# Patient Record
Sex: Female | Born: 1960 | Race: White | Hispanic: No | Marital: Single | Smoking: Never smoker
Health system: Southern US, Community
[De-identification: ages and names within clinical notes are randomized; demographics above are authoritative.]

## PROBLEM LIST (undated history)

## (undated) DIAGNOSIS — F419 Anxiety disorder, unspecified: Secondary | ICD-10-CM

## (undated) DIAGNOSIS — B9681 Helicobacter pylori [H. pylori] as the cause of diseases classified elsewhere: Secondary | ICD-10-CM

## (undated) DIAGNOSIS — M1990 Unspecified osteoarthritis, unspecified site: Secondary | ICD-10-CM

## (undated) DIAGNOSIS — Z87442 Personal history of urinary calculi: Secondary | ICD-10-CM

## (undated) DIAGNOSIS — N289 Disorder of kidney and ureter, unspecified: Secondary | ICD-10-CM

## (undated) DIAGNOSIS — K219 Gastro-esophageal reflux disease without esophagitis: Secondary | ICD-10-CM

## (undated) DIAGNOSIS — G4730 Sleep apnea, unspecified: Secondary | ICD-10-CM

## (undated) DIAGNOSIS — G43909 Migraine, unspecified, not intractable, without status migrainosus: Secondary | ICD-10-CM

## (undated) DIAGNOSIS — K3184 Gastroparesis: Secondary | ICD-10-CM

## (undated) DIAGNOSIS — B029 Zoster without complications: Secondary | ICD-10-CM

## (undated) DIAGNOSIS — E7800 Pure hypercholesterolemia, unspecified: Secondary | ICD-10-CM

## (undated) DIAGNOSIS — E119 Type 2 diabetes mellitus without complications: Secondary | ICD-10-CM

## (undated) HISTORY — PX: TUBAL LIGATION: SHX77

## (undated) HISTORY — PX: CYSTOSCOPY: SUR368

## (undated) HISTORY — PX: OTHER SURGICAL HISTORY: SHX169

## (undated) HISTORY — DX: Helicobacter pylori (H. pylori) as the cause of diseases classified elsewhere: B96.81

## (undated) HISTORY — DX: Peptic ulcer, site unspecified, unspecified as acute or chronic, without hemorrhage or perforation: B96.81

## (undated) HISTORY — PX: CHOLECYSTECTOMY: SHX55

## (undated) HISTORY — PX: ENDOMETRIAL ABLATION: SHX621

---

## 2000-08-04 ENCOUNTER — Inpatient Hospital Stay (HOSPITAL_COMMUNITY): Admission: EM | Admit: 2000-08-04 | Discharge: 2000-08-07 | Payer: Self-pay | Admitting: Family Medicine

## 2000-08-04 ENCOUNTER — Encounter: Payer: Self-pay | Admitting: *Deleted

## 2001-03-01 ENCOUNTER — Ambulatory Visit (HOSPITAL_COMMUNITY): Admission: RE | Admit: 2001-03-01 | Discharge: 2001-03-01 | Payer: Self-pay | Admitting: Internal Medicine

## 2001-03-01 ENCOUNTER — Encounter: Payer: Self-pay | Admitting: Internal Medicine

## 2001-03-26 ENCOUNTER — Ambulatory Visit (HOSPITAL_COMMUNITY): Admission: RE | Admit: 2001-03-26 | Discharge: 2001-03-26 | Payer: Self-pay | Admitting: Internal Medicine

## 2001-03-26 ENCOUNTER — Encounter: Payer: Self-pay | Admitting: Internal Medicine

## 2001-03-28 ENCOUNTER — Emergency Department (HOSPITAL_COMMUNITY): Admission: EM | Admit: 2001-03-28 | Discharge: 2001-03-28 | Payer: Self-pay | Admitting: *Deleted

## 2002-06-20 ENCOUNTER — Ambulatory Visit (HOSPITAL_COMMUNITY): Admission: RE | Admit: 2002-06-20 | Discharge: 2002-06-20 | Payer: Self-pay | Admitting: Obstetrics & Gynecology

## 2003-08-18 ENCOUNTER — Inpatient Hospital Stay (HOSPITAL_COMMUNITY): Admission: EM | Admit: 2003-08-18 | Discharge: 2003-08-20 | Payer: Self-pay | Admitting: Emergency Medicine

## 2003-11-27 ENCOUNTER — Emergency Department (HOSPITAL_COMMUNITY): Admission: EM | Admit: 2003-11-27 | Discharge: 2003-11-27 | Payer: Self-pay | Admitting: Emergency Medicine

## 2004-08-01 ENCOUNTER — Emergency Department: Payer: Self-pay | Admitting: Emergency Medicine

## 2004-08-11 ENCOUNTER — Ambulatory Visit: Payer: Self-pay | Admitting: Specialist

## 2005-02-19 ENCOUNTER — Emergency Department: Payer: Self-pay | Admitting: Emergency Medicine

## 2005-02-24 ENCOUNTER — Emergency Department: Payer: Self-pay | Admitting: Emergency Medicine

## 2008-08-11 ENCOUNTER — Emergency Department: Payer: Self-pay | Admitting: Emergency Medicine

## 2008-08-17 ENCOUNTER — Ambulatory Visit: Payer: Self-pay

## 2009-12-14 ENCOUNTER — Emergency Department: Payer: Self-pay | Admitting: Emergency Medicine

## 2010-03-03 ENCOUNTER — Emergency Department: Payer: Self-pay | Admitting: Emergency Medicine

## 2010-07-03 ENCOUNTER — Emergency Department: Payer: Self-pay | Admitting: Emergency Medicine

## 2012-01-12 DIAGNOSIS — F32A Depression, unspecified: Secondary | ICD-10-CM | POA: Insufficient documentation

## 2012-01-12 DIAGNOSIS — F329 Major depressive disorder, single episode, unspecified: Secondary | ICD-10-CM | POA: Insufficient documentation

## 2012-02-12 DIAGNOSIS — I10 Essential (primary) hypertension: Secondary | ICD-10-CM | POA: Insufficient documentation

## 2012-03-12 DIAGNOSIS — G4700 Insomnia, unspecified: Secondary | ICD-10-CM | POA: Insufficient documentation

## 2012-08-30 DIAGNOSIS — J309 Allergic rhinitis, unspecified: Secondary | ICD-10-CM | POA: Insufficient documentation

## 2012-08-30 DIAGNOSIS — N959 Unspecified menopausal and perimenopausal disorder: Secondary | ICD-10-CM | POA: Insufficient documentation

## 2012-08-30 DIAGNOSIS — G43909 Migraine, unspecified, not intractable, without status migrainosus: Secondary | ICD-10-CM | POA: Insufficient documentation

## 2013-07-02 ENCOUNTER — Ambulatory Visit: Payer: Self-pay | Admitting: Surgery

## 2013-07-03 LAB — PATHOLOGY REPORT

## 2013-09-30 DIAGNOSIS — M25569 Pain in unspecified knee: Secondary | ICD-10-CM | POA: Insufficient documentation

## 2013-11-27 ENCOUNTER — Emergency Department: Payer: Self-pay | Admitting: Emergency Medicine

## 2013-11-27 LAB — CBC
HCT: 43.6 % (ref 35.0–47.0)
HGB: 14.6 g/dL (ref 12.0–16.0)
MCH: 31.9 pg (ref 26.0–34.0)
MCHC: 33.4 g/dL (ref 32.0–36.0)
MCV: 96 fL (ref 80–100)
PLATELETS: 267 10*3/uL (ref 150–440)
RBC: 4.57 10*6/uL (ref 3.80–5.20)
RDW: 12.6 % (ref 11.5–14.5)
WBC: 13.9 10*3/uL — AB (ref 3.6–11.0)

## 2013-11-27 LAB — BASIC METABOLIC PANEL
Anion Gap: 9 (ref 7–16)
BUN: 15 mg/dL (ref 7–18)
CALCIUM: 8.9 mg/dL (ref 8.5–10.1)
CHLORIDE: 105 mmol/L (ref 98–107)
CO2: 26 mmol/L (ref 21–32)
CREATININE: 0.77 mg/dL (ref 0.60–1.30)
GLUCOSE: 101 mg/dL — AB (ref 65–99)
OSMOLALITY: 280 (ref 275–301)
Potassium: 3.5 mmol/L (ref 3.5–5.1)
SODIUM: 140 mmol/L (ref 136–145)

## 2013-11-27 LAB — TROPONIN I: Troponin-I: <0.02 ng/mL

## 2013-11-28 LAB — TROPONIN I: Troponin-I: <0.02 ng/mL

## 2013-12-17 DIAGNOSIS — F410 Panic disorder [episodic paroxysmal anxiety] without agoraphobia: Secondary | ICD-10-CM | POA: Insufficient documentation

## 2014-06-24 ENCOUNTER — Other Ambulatory Visit: Payer: Self-pay | Admitting: Family Medicine

## 2014-06-24 DIAGNOSIS — Z1231 Encounter for screening mammogram for malignant neoplasm of breast: Secondary | ICD-10-CM

## 2015-01-21 DIAGNOSIS — E119 Type 2 diabetes mellitus without complications: Secondary | ICD-10-CM | POA: Insufficient documentation

## 2015-02-16 ENCOUNTER — Encounter: Payer: Self-pay | Admitting: Medical Oncology

## 2015-02-16 ENCOUNTER — Ambulatory Visit (INDEPENDENT_AMBULATORY_CARE_PROVIDER_SITE_OTHER)
Admission: EM | Admit: 2015-02-16 | Discharge: 2015-02-16 | Disposition: A | Discharge status: Other Acute Inpatient Hospital | Payer: Commercial Managed Care - HMO | Source: Home / Self Care | Attending: Family Medicine | Admitting: Family Medicine

## 2015-02-16 ENCOUNTER — Emergency Department
Admission: EM | Admit: 2015-02-16 | Discharge: 2015-02-16 | Disposition: A | Discharge status: Home / Self Care | Payer: Commercial Managed Care - HMO | Attending: Emergency Medicine | Admitting: Emergency Medicine

## 2015-02-16 ENCOUNTER — Emergency Department: Payer: Commercial Managed Care - HMO

## 2015-02-16 ENCOUNTER — Encounter: Payer: Self-pay | Admitting: *Deleted

## 2015-02-16 DIAGNOSIS — E119 Type 2 diabetes mellitus without complications: Secondary | ICD-10-CM | POA: Diagnosis not present

## 2015-02-16 DIAGNOSIS — K760 Fatty (change of) liver, not elsewhere classified: Secondary | ICD-10-CM | POA: Diagnosis not present

## 2015-02-16 DIAGNOSIS — Z79899 Other long term (current) drug therapy: Secondary | ICD-10-CM | POA: Diagnosis not present

## 2015-02-16 DIAGNOSIS — Z791 Long term (current) use of non-steroidal anti-inflammatories (NSAID): Secondary | ICD-10-CM | POA: Diagnosis not present

## 2015-02-16 DIAGNOSIS — Z7984 Long term (current) use of oral hypoglycemic drugs: Secondary | ICD-10-CM | POA: Insufficient documentation

## 2015-02-16 DIAGNOSIS — K429 Umbilical hernia without obstruction or gangrene: Secondary | ICD-10-CM

## 2015-02-16 DIAGNOSIS — R100 Acute abdomen: Secondary | ICD-10-CM

## 2015-02-16 DIAGNOSIS — K439 Ventral hernia without obstruction or gangrene: Secondary | ICD-10-CM | POA: Insufficient documentation

## 2015-02-16 DIAGNOSIS — R1011 Right upper quadrant pain: Secondary | ICD-10-CM | POA: Diagnosis present

## 2015-02-16 DIAGNOSIS — R109 Unspecified abdominal pain: Secondary | ICD-10-CM

## 2015-02-16 HISTORY — DX: Disorder of kidney and ureter, unspecified: N28.9

## 2015-02-16 HISTORY — DX: Migraine, unspecified, not intractable, without status migrainosus: G43.909

## 2015-02-16 HISTORY — DX: Type 2 diabetes mellitus without complications: E11.9

## 2015-02-16 LAB — CBC
HEMATOCRIT: 39.3 % (ref 35.0–47.0)
Hemoglobin: 13.5 g/dL (ref 12.0–16.0)
MCH: 31.2 pg (ref 26.0–34.0)
MCHC: 34.3 g/dL (ref 32.0–36.0)
MCV: 91 fL (ref 80.0–100.0)
Platelets: 264 10*3/uL (ref 150–440)
RBC: 4.32 MIL/uL (ref 3.80–5.20)
RDW: 12.6 % (ref 11.5–14.5)
WBC: 9.6 10*3/uL (ref 3.6–11.0)

## 2015-02-16 LAB — COMPREHENSIVE METABOLIC PANEL
ALBUMIN: 4.5 g/dL (ref 3.5–5.0)
ALT: 53 U/L (ref 14–54)
AST: 35 U/L (ref 15–41)
Alkaline Phosphatase: 86 U/L (ref 38–126)
Anion gap: 9 (ref 5–15)
BUN: 16 mg/dL (ref 6–20)
CHLORIDE: 105 mmol/L (ref 101–111)
CO2: 25 mmol/L (ref 22–32)
Calcium: 9.6 mg/dL (ref 8.9–10.3)
Creatinine, Ser: 0.66 mg/dL (ref 0.44–1.00)
GFR calc Af Amer: >60 mL/min (ref >60)
Glucose, Bld: 117 mg/dL — ABNORMAL HIGH (ref 65–99)
POTASSIUM: 4.3 mmol/L (ref 3.5–5.1)
SODIUM: 139 mmol/L (ref 135–145)
Total Bilirubin: 0.4 mg/dL (ref 0.3–1.2)
Total Protein: 7.7 g/dL (ref 6.5–8.1)

## 2015-02-16 LAB — URINALYSIS COMPLETE WITH MICROSCOPIC (ARMC ONLY)
GLUCOSE, UA: NEGATIVE mg/dL
HGB URINE DIPSTICK: NEGATIVE
Ketones, ur: NEGATIVE mg/dL
LEUKOCYTES UA: NEGATIVE
Nitrite: NEGATIVE
PH: 6 (ref 5.0–8.0)
Protein, ur: NEGATIVE mg/dL
RBC / HPF: NONE SEEN RBC/hpf (ref 0–5)
Specific Gravity, Urine: 1.024 (ref 1.005–1.030)

## 2015-02-16 LAB — LIPASE, BLOOD: LIPASE: 29 U/L (ref 11–51)

## 2015-02-16 MED ORDER — ONDANSETRON HCL 4 MG/2ML IJ SOLN
4.0000 mg | Freq: Once | INTRAMUSCULAR | Status: AC
  Administered 2015-02-16: 4 mg via INTRAVENOUS
  Filled 2015-02-16: qty 2

## 2015-02-16 MED ORDER — IOHEXOL 240 MG/ML SOLN
25.0000 mL | Freq: Once | INTRAMUSCULAR | Status: AC | PRN
  Administered 2015-02-16: 25 mL via ORAL
  Filled 2015-02-16: qty 25

## 2015-02-16 MED ORDER — OXYCODONE-ACETAMINOPHEN 5-325 MG PO TABS: 1.0000 | ORAL_TABLET | Freq: Four times a day (QID) | ORAL | Status: DC | PRN

## 2015-02-16 MED ORDER — IOHEXOL 300 MG/ML  SOLN
100.0000 mL | Freq: Once | INTRAMUSCULAR | Status: AC | PRN
  Administered 2015-02-16: 100 mL via INTRAVENOUS
  Filled 2015-02-16: qty 100

## 2015-02-16 MED ORDER — KETOROLAC TROMETHAMINE 30 MG/ML IJ SOLN
30.0000 mg | Freq: Once | INTRAMUSCULAR | Status: AC
  Administered 2015-02-16: 30 mg via INTRAVENOUS
  Filled 2015-02-16: qty 1

## 2015-02-16 MED ORDER — DOCUSATE SODIUM 100 MG PO CAPS: 200.0000 mg | ORAL_CAPSULE | Freq: Two times a day (BID) | ORAL | Status: DC

## 2015-02-16 MED ORDER — SODIUM CHLORIDE 0.9 % IV BOLUS (SEPSIS)
1000.0000 mL | Freq: Once | INTRAVENOUS | Status: AC
  Administered 2015-02-16: 1000 mL via INTRAVENOUS

## 2015-02-16 NOTE — ED Provider Notes (Signed)
[facility] Emergency Department Provider Note  ____________________________________________  Time seen: 5:30 PM  I have reviewed the triage vital signs and the nursing notes.   HISTORY  Chief Complaint Abdominal Pain    HPI Ashley Dickson is a 55 y.o. female who complains of central abdominal pain for the past 4 weeks. A CT was done at [facility] on January 19 which showed that she had a ventral hernia. Denies any nausea vomiting. Her last bowel movement was 2 days ago and normally gets every day. She notes that she has a worsening bulging her upper abdomen when she stands up. Today she also noticed that the skin overlying her right upper quadrant appears to be going numb. This is never happened before.     Past Medical History  Diagnosis Date  . Diabetes mellitus without complication (HCC)   . Renal disorder   . Migraines      There are no active problems to display for this patient.    Past Surgical History  Procedure Laterality Date  . Cholecystectomy    . Tubal ligation       Current Outpatient Rx  Name  Route  Sig  Dispense  Refill  . busPIRone (BUSPAR) 15 MG tablet   Oral   Take 15 mg by mouth 2 (two) times daily.         Marland Kitchen docusate sodium (COLACE) 100 MG capsule   Oral   Take 2 capsules (200 mg total) by mouth 2 (two) times daily.   120 capsule   0   . etodolac (LODINE) 500 MG tablet   Oral   Take 500 mg by mouth daily.         Marland Kitchen lisinopril (PRINIVIL,ZESTRIL) 10 MG tablet   Oral   Take 10 mg by mouth daily.         Marland Kitchen losartan (COZAAR) 25 MG tablet   Oral   Take 25 mg by mouth daily.         . metFORMIN (GLUCOPHAGE) 1000 MG tablet   Oral   Take 1,000 mg by mouth 2 (two) times daily with a meal.         . oxyCODONE-acetaminophen (ROXICET) 5-325 MG tablet   Oral   Take 1 tablet by mouth every 6 (six) hours as needed for severe pain.   10 tablet   0   . sertraline (ZOLOFT) 100 MG tablet   Oral   Take 100 mg by  mouth daily.         Marland Kitchen zolpidem (AMBIEN) 5 MG tablet   Oral   Take 5 mg by mouth at bedtime as needed for sleep.            Allergies Azithromycin and Tramadol   No family history on file.  Social History Social History  Substance Use Topics  . Smoking status: Never Smoker   . Smokeless tobacco: Never Used  . Alcohol Use: Yes    Review of Systems  Constitutional:   No fever or chills. No weight changes Eyes:   No blurry vision or double vision.  ENT:   No sore throat. Cardiovascular:   No chest pain. Respiratory:   No dyspnea or cough. Gastrointestinal:   Positive as above for abdominal pain without vomiting or diarrhea.  No BRBPR or melena. Genitourinary:   Negative for dysuria, urinary retention, bloody urine, or difficulty urinating. Musculoskeletal:   Negative for back pain. No joint swelling or pain. Skin:   Negative for  rash.  Neurological:   Negative for headaches, focal weakness or numbness. Psychiatric:  No anxiety or depression.   Endocrine:  No hot/cold intolerance, changes in energy, or sleep difficulty.  10-point ROS otherwise negative.  ____________________________________________   PHYSICAL EXAM:  VITAL SIGNS: ED Triage Vitals  Enc Vitals Group     BP 02/16/15 1601 138/78 mmHg     Pulse Rate 02/16/15 1601 77     Resp 02/16/15 1601 18     Temp 02/16/15 1601 97.6 F (36.4 C)     Temp Source 02/16/15 1601 Oral     SpO2 02/16/15 1601 100 %     Weight 02/16/15 1601 223 lb (101.152 kg)     Height 02/16/15 1601 5\' 6"  (1.676 m)     Head Cir --      Peak Flow --      Pain Score 02/16/15 1601 8     Pain Loc --      Pain Edu? --      Excl. in GC? --     Vital signs reviewed, nursing assessments reviewed.   Constitutional:   Alert and oriented. Well appearing and in no distress. Eyes:   No scleral icterus. No conjunctival pallor. PERRL. EOMI ENT   Head:   Normocephalic and atraumatic.   Nose:   No congestion/rhinnorhea. No septal  hematoma   Mouth/Throat:   MMM, no pharyngeal erythema. No peritonsillar mass. No uvula shift.   Neck:   No stridor. No SubQ emphysema. No meningismus. Hematological/Lymphatic/Immunilogical:   No cervical lymphadenopathy. Cardiovascular:   RRR. Normal and symmetric distal pulses are present in all extremities. No murmurs, rubs, or gallops. Respiratory:   Normal respiratory effort without tachypnea nor retractions. Breath sounds are clear and equal bilaterally. No wheezes/rales/rhonchi. Gastrointestinal:   Soft and nontender. No distention. There is no CVA tenderness.  No rebound, rigidity, or guarding. There is a ventral hernia approximate 4-5 cm in size, mildly tender, easily mobile and reducible, nonincarcerated. Genitourinary:   deferred Musculoskeletal:   Nontender with normal range of motion in all extremities. No joint effusions.  No lower extremity tenderness.  No edema. Neurologic:   Normal speech and language.  CN 2-10 normal. Motor grossly intact. No pronator drift.  Normal gait. No gross focal neurologic deficits are appreciated.  Skin:    Skin is warm, dry and intact. No rash noted.  No petechiae, purpura, or bullae. There is altered sensation of the skin to the right laterally of the ventral hernia covering the area of the right upper quadrant. Inferiorly, medially, and even more laterally around the right flank she has normal sensation, not corresponding to a dermatomal distribution. Psychiatric:   Mood and affect are normal. Speech and behavior are normal. Patient exhibits appropriate insight and judgment.  ____________________________________________    LABS (pertinent positives/negatives) (all labs ordered are listed, but only abnormal results are displayed) Labs Reviewed  COMPREHENSIVE METABOLIC PANEL - Abnormal; Notable for the following:    Glucose, Bld 117 (*)    All other components within normal limits  URINALYSIS COMPLETEWITH MICROSCOPIC ([facility] ONLY) -  Abnormal; Notable for the following:    Color, Urine YELLOW (*)    APPearance CLEAR (*)    Bilirubin Urine 2+ (*)    Bacteria, UA RARE (*)    Squamous Epithelial / LPF 0-5 (*)    All other components within normal limits  LIPASE, BLOOD  CBC   ____________________________________________   EKG    ____________________________________________    RADIOLOGY (  [facility] CT) DATE: 01/28/15 18:27:43 ACCESSION: [id number] UN DICTATED: 01/28/15 18:59:22 INTERPRETATION LOCATION: Main [facility]  CLINICAL INDICATION: 55 Year Old (F): ABDOMINAL PAIN, (specify site in comments)-epigastric Pt had tubes tied.   COMPARISON: None.  TECHNIQUE: A spiral CT scan was obtained with IV contrast from the lung bases to the pubic symphysis. Images were reconstructed in the axial plane. Coronal and sagittal reformatted images were also provided for further evaluation.  FINDINGS:  The lung bases are clear.  Hepatic steatosis. A hyperattenuating region in hepatic segments VIII and V measures approximately 3.2 x 2.2 x 5.3 cm (AP x ML x CC, 5:43, 6:75). Gallbladder is surgically absent. Fatty atrophy of the pancreas.  The spleen/splenules and adrenals are unremarkable.  Bilateral lower pole nonobstructive renal calculi measure up to 6 mm on the left and 3 mm on the right. No evidence of hydronephrosis.  There is mild mesenteric fat stranding in the mid abdomen adjacent to the third portion of the duodenum (3:31). Sigmoid diverticula without evidence of diverticulitis. The appendix is not identified. No evidence of bowel obstruction. Small fat-containing umbilical hernia.  The uterus and ovaries are unremarkable. The bladder is moderately distended.  The aorta and its branch vessels are patent. The portal vein, splenic vein, and SMV are patent.   Subcentimeter mesenteric lymph nodes in the mid abdomen are likely reactive.   No focal osseous lesions.   IMPRESSION: -Mild fast stranding adjacent to the  duodenum, may represent early or developing duodenitis.  -Nonobstructive bilateral nephrolithiasis. No hydronephrosis.  -Hyperdense hepatic mass, indeterminant. Recommend follow-up non-emergent MRI with contrast for further evaluation when clinically indicated.  -Hepatic steatosis.   ADDENDUM:  1) No duodenitis identified. 2) Hyperattenuating hepatic lesion likely represents an unusual pattern of fatty sparing in this patient with diffuse hepatic steatosis. Abdominal MRI is again recommended for confirmation.    [place] [facility]strates hepatic steatosis but otherwise unremarkable  ____________________________________________   PROCEDURES   ____________________________________________   INITIAL IMPRESSION / ASSESSMENT AND PLAN / ED COURSE  Pertinent labs & imaging results that were available during my care of the patient were reviewed by me and considered in my medical decision making (see chart for details).  Patient well appearing no acute distress, presents with paresthesia of the abdominal wall as well as a ventral hernia. No evidence of obstruction at this time but with her worsen constipation, we'll repeat CT today.  ----------------------------------------- 8:40 PM on 02/16/2015 -----------------------------------------  CT negative. Patient informed of hepatic steatosis and will follow up with primary care regarding this finding. Vital signs remained stable and completely normal. No evidence of hernia on CT further confirming that it is completely reduced at this time. Suggested patient try a abdominal binder. Patient's name relayed to the [facility] surgical to help facilitate follow-up.     ____________________________________________   FINAL CLINICAL IMPRESSION(S) / ED DIAGNOSES  Final diagnoses:  Hepatic steatosis  Hernia of anterior abdominal wall      Sharman Cheek, MD 02/16/15 2042

## 2015-02-16 NOTE — Discharge Instructions (Signed)
You were prescribed a medication that is potentially sedating. Do not drink alcohol, drive or participate in any other potentially dangerous activities while taking this medication as it may make you sleepy. Do not take this medication with any other sedating medications, either prescription or over-the-counter. If you were prescribed Percocet or Vicodin, do not take these with acetaminophen (Tylenol) as it is already contained within these medications.   Opioid pain medications (or "narcotics") can be habit forming.  Use it as little as possible to achieve adequate pain control.  Do not use or use it with extreme caution if you have a history of opiate abuse or dependence.  If you are on a pain contract with your primary care doctor or a pain specialist, be sure to let them know you were prescribed this medication today from the [facility].  This medication is intended for your use only - do not give any to anyone else and keep it in a secure place where nobody else, especially children and pets, have access to it.  It will also cause or worsen constipation, so you may want to consider taking an over-the-counter stool softener while you are taking this medication.  Fatty Liver Fatty liver, also called hepatic steatosis or steatohepatitis, is a condition in which too much fat has built up in your liver cells. The liver removes harmful substances from your bloodstream. It produces fluids your body needs. It also helps your body use and store energy from the food you eat. In many cases, fatty liver does not cause symptoms or problems. It is often diagnosed when tests are being done for other reasons. However, over time, fatty liver can cause inflammation that may lead to more serious liver problems, such as scarring of the liver (cirrhosis). CAUSES  Causes of fatty liver may include:   Drinking too much alcohol.  Poor nutrition.  Obesity.  Cushing  syndrome.  Diabetes.  Hyperlipidemia.  Pregnancy.  Certain drugs.  Poisons.  Some viral infections. RISK FACTORS You may be more likely to develop fatty liver if you:  Abuse alcohol.  Are pregnant.  Are overweight.  Have diabetes.  Have hepatitis.  Have a high triglyceride level.  SIGNS AND SYMPTOMS  Fatty liver often does not cause any symptoms. In cases where symptoms develop, they can include:  Fatigue.  Weakness.  Weight loss.  Confusion.   Abdominal pain.  Yellowing of your skin and the white parts of your eyes (jaundice).  Nausea and vomiting. DIAGNOSIS  Fatty liver may be diagnosed by:   Physical exam and medical history.  Blood tests.  Imaging tests, such as an ultrasound, CT scan, or MRI.  Liver biopsy. A small sample of liver tissue is removed using a needle. The sample is then looked at under a microscope. TREATMENT  Fatty liver is often caused by other health conditions. Treatment for fatty liver may involve medicines and lifestyle changes to manage conditions such as:   Alcoholism.  High cholesterol.  Diabetes.  Being overweight or obese.  HOME CARE INSTRUCTIONS  Eat a healthy diet as directed by your health care provider.  Exercise regularly. This can help you lose weight and control your cholesterol and diabetes. Talk to your health care provider about an exercise plan and which activities are best for you.  Do not drink alcohol.   Take medicines only as directed by your health care provider. SEEK MEDICAL CARE IF: You have difficulty controlling your:  Blood sugar.  Cholesterol.  Alcohol consumption. SEEK IMMEDIATE MEDICAL CARE IF:  You have abdominal pain.  You have jaundice.  You have nausea and vomiting.   This information is not intended to replace advice given to you by your health care provider. Make sure you discuss any questions you have with your health care provider.   Document Released: 02/10/2005  Document Revised: 01/16/2014 Document Reviewed: 05/07/2013 Elsevier Interactive Patient Education 2016 [employer] Inc[employer]  Hernia, Adult A hernia is the bulging of an organ or tissue through a weak spot in the muscles of the abdomen (abdominal wall). Hernias develop most often near the navel or groin. There are many kinds of hernias. Common kinds include:  Femoral hernia. This kind of hernia develops under the groin in the upper thigh area.  Inguinal hernia. This kind of hernia develops in the groin or scrotum.  Umbilical hernia. This kind of hernia develops near the navel.  Hiatal hernia. This kind of hernia causes part of the stomach to be pushed up into the chest.  Incisional hernia. This kind of hernia bulges through a scar from an abdominal surgery. CAUSES This condition may be caused by:  Heavy lifting.  Coughing over a long period of time.  Straining to have a bowel movement.  An incision made during an abdominal surgery.  A birth defect (congenital defect).  Excess weight or obesity.  Smoking.  Poor nutrition.  Cystic fibrosis.  Excess fluid in the abdomen.  Undescended testicles. SYMPTOMS Symptoms of a hernia include:  A lump on the abdomen. This is the first sign of a hernia. The lump may become more obvious with standing, straining, or coughing. It may get bigger over time if it is not treated or if the condition causing it is not treated.  Pain. A hernia is usually painless, but it may become painful over time if treatment is delayed. The pain is usually dull and may get worse with standing or lifting heavy objects. Sometimes a hernia gets tightly squeezed in the weak spot (strangulated) or stuck there (incarcerated) and causes additional symptoms. These symptoms may include:  Vomiting.  Nausea.  Constipation.  Irritability. DIAGNOSIS A hernia may be diagnosed with:  A physical exam. During the exam your health care provider may ask you to cough or to  make a specific movement, because a hernia is usually more visible when you move.  Imaging tests. These can include:  X-rays.  Ultrasound.  CT scan. TREATMENT A hernia that is small and painless may not need to be treated. A hernia that is large or painful may be treated with surgery. Inguinal hernias may be treated with surgery to prevent incarceration or strangulation. Strangulated hernias are always treated with surgery, because lack of blood to the trapped organ or tissue can cause it to die. Surgery to treat a hernia involves pushing the bulge back into place and repairing the weak part of the abdomen. HOME CARE INSTRUCTIONS  Avoid straining.  Do not lift anything heavier than 10 lb (4.5 kg).  Lift with your leg muscles, not your back muscles. This helps avoid strain.  When coughing, try to cough gently.  Prevent constipation. Constipation leads to straining with bowel movements, which can make a hernia worse or cause a hernia repair to break down. You can prevent constipation by:  Eating a high-fiber diet that includes plenty of fruits and vegetables.  Drinking enough fluids to keep your urine clear or pale yellow. Aim to drink 6-8 glasses of water per day.  Using  a stool softener as directed by your health care provider.  Lose weight, if you are overweight.  Do not use any tobacco products, including cigarettes, chewing tobacco, or electronic cigarettes. If you need help quitting, ask your health care provider.  Keep all follow-up visits as directed by your health care provider. This is important. Your health care provider may need to monitor your condition. SEEK MEDICAL CARE IF:  You have swelling, redness, and pain in the affected area.  Your bowel habits change. SEEK IMMEDIATE MEDICAL CARE IF:  You have a fever.  You have abdominal pain that is getting worse.  You feel nauseous or you vomit.  You cannot push the hernia back in place by gently pressing on it  while you are lying down.  The hernia:  Changes in shape or size.  Is stuck outside the abdomen.  Becomes discolored.  Feels hard or tender.   This information is not intended to replace advice given to you by your health care provider. Make sure you discuss any questions you have with your health care provider.   Document Released: 12/26/2004 Document Revised: 01/16/2014 Document Reviewed: 11/05/2013 Elsevier Interactive Patient Education [employer]

## 2015-02-16 NOTE — ED Notes (Signed)
Patient transported to CT 

## 2015-02-16 NOTE — ED Notes (Signed)
Pt ambulatory to triage with reports of central abd pain x 4 weeks, pt had CT done and was told that she has a hernia. Yesterday pt noticed that the rt side of her abdomen felt "numb". Pt denies chest pain/sob. Denies vomiting/diarrhea. No BM in 2 days.

## 2015-02-16 NOTE — ED Provider Notes (Signed)
CSN: [id number]     Arrival date & time 02/16/15  1332 History   First MD Initiated Contact with Patient 02/16/15 1453    Nurses notes were reviewed. Chief Complaint  Patient presents with  . Hernia    umbilical   Patient was diagnosed with a umbilical/ventral hernia at [facility] about 2 weeks ago. She seen her PCP and they've been trying to get her in to [facility] surgical for evaluation. She reports that [facility] surgical is negative and information from a PCP yet or the go ahead to be seen by them but today started having increased pain around the right side of the hernia with increased numbness and discomfort. He caught [facility] and they recommended she go to the emergency room to be seen and evaluated and she came here instead just to see if anything can be done here.  She's had gallbladder surgery she's had tubal ligation and she does not smoke. She is allergic to Zithromax and tramadol. Does have diabetes kidney disease and migraines. (Consider location/radiation/quality/duration/timing/severity/associated sxs/prior Treatment) The history is provided by the patient.    Past Medical History  Diagnosis Date  . Diabetes mellitus without complication (HCC)   . Renal disorder   . Migraines    Past Surgical History  Procedure Laterality Date  . Cholecystectomy    . Tubal ligation     History reviewed. No pertinent family history. Social History  Substance Use Topics  . Smoking status: Never Smoker   . Smokeless tobacco: Never Used  . Alcohol Use: Yes   OB History    No data available     Review of Systems  Gastrointestinal: Positive for abdominal pain.    Allergies  Azithromycin and Tramadol  Home Medications   Prior to Admission medications   Medication Sig Start Date End Date Taking? Authorizing Provider  busPIRone (BUSPAR) 15 MG tablet Take 15 mg by mouth 2 (two) times daily.   Yes Historical Provider, MD  etodolac (LODINE) 500 MG tablet Take 500 mg by mouth daily.   Yes  Historical Provider, MD  lisinopril (PRINIVIL,ZESTRIL) 10 MG tablet Take 10 mg by mouth daily.   Yes Historical Provider, MD  losartan (COZAAR) 25 MG tablet Take 25 mg by mouth daily.   Yes Historical Provider, MD  metFORMIN (GLUCOPHAGE) 1000 MG tablet Take 1,000 mg by mouth 2 (two) times daily with a meal.   Yes Historical Provider, MD  sertraline (ZOLOFT) 100 MG tablet Take 100 mg by mouth daily.   Yes Historical Provider, MD  zolpidem (AMBIEN) 5 MG tablet Take 5 mg by mouth at bedtime as needed for sleep.   Yes Historical Provider, MD   Meds Ordered and Administered this Visit  Medications - No data to display  BP 121/78 mmHg  Pulse 76  Temp(Src) 98.3 F (36.8 C) (Oral)  Resp 18  Ht  (1.676 m)  Wt 223 lb (101.152 kg)  BMI 36.01 kg/m2  SpO2 100% No data found.   Physical Exam  Constitutional: She is oriented to person, place, and time. She appears well-developed and well-nourished.  Non-toxic appearance. She does not have a sickly appearance. She does not appear ill. She appears distressed.  Obese white female  Eyes: Conjunctivae are normal. Pupils are equal, round, and reactive to light.  Neck: Neck supple.  Abdominal: Soft. There is no hepatosplenomegaly. There is tenderness. There is no CVA tenderness.    Neurological: She is alert and oriented to person, place, and time. No cranial  nerve deficit.  Skin: Skin is warm.  Psychiatric: She has a normal mood and affect.  Vitals reviewed.   ED Course  Procedures (including critical care time)  Labs Review Labs Reviewed - No data to display  Imaging Review No results found.   Visual Acuity Review  Right Eye Distance:   Left Eye Distance:   Bilateral Distance:    Right Eye Near:   Left Eye Near:    Bilateral Near:         MDM   1. Umbilical hernia, recurrence not specified   2. Abdominal pain, acute    At this time with a diagnosis umbilical/ventral hernia already in the books and with the goal is to  get her to see a surgeon will caught [facility] and see if a surgeon is available this afternoon now and if not refer to the ED as initially suggested this morning to see a surgeon to be evaluated.    Hassan Rowan, MD 02/16/15 [id number]

## 2015-02-16 NOTE — Discharge Instructions (Signed)
C2 the ED to see surgeon on call of Dr. [facility] Dr. Ricarda Frame. Abdominal Pain, Adult Many things can cause belly (abdominal) pain. Most times, the belly pain is not dangerous. Many cases of belly pain can be watched and treated at home. HOME CARE   Do not take medicines that help you go poop (laxatives) unless told to by your doctor.  Only take medicine as told by your doctor.  Eat or drink as told by your doctor. Your doctor will tell you if you should be on a special diet. GET HELP IF:  You do not know what is causing your belly pain.  You have belly pain while you are sick to your stomach (nauseous) or have runny poop (diarrhea).  You have pain while you pee or poop.  Your belly pain wakes you up at night.  You have belly pain that gets worse or better when you eat.  You have belly pain that gets worse when you eat fatty foods.  You have a fever. GET HELP RIGHT AWAY IF:   The pain does not go away within 2 hours.  You keep throwing up (vomiting).  The pain changes and is only in the right or left part of the belly.  You have bloody or tarry looking poop. MAKE SURE YOU:   Understand these instructions.  Will watch your condition.  Will get help right away if you are not doing well or get worse.   This information is not intended to replace advice given to you by your health care provider. Make sure you discuss any questions you have with your health care provider.   Document Released: 06/14/2007 Document Revised: 01/16/2014 Document Reviewed: 09/04/2012 Elsevier Interactive Patient Education 2016 Else[employer] Inc[employer]  Hernia A hernia happens when an organ or tissue inside your body pushes out through a weak spot in the belly (abdomen). HOME CARE  Avoid stretching or overusing (straining) the muscles near the hernia.  Do not lift anything heavier than 10 lb (4.5 kg).  Use the muscles in your leg when you lift something up. Do not use the muscles in  your back.  When you cough, try to cough gently.  Eat a diet that has a lot of fiber. Eat lots of fruits and vegetables.  Drink enough fluids to keep your pee (urine) clear or pale yellow. Try to drink 6-8 glasses of water a day.  Take medicines to make your poop soft (stool softeners) as told by your doctor.  Lose weight, if you are overweight.  Do not use any tobacco products, including cigarettes, chewing tobacco, or electronic cigarettes. If you need help quitting, ask your doctor.  Keep all follow-up visits as told by your doctor. This is important. GET HELP IF:  The skin by the hernia gets puffy (swollen) or red.  The hernia is painful. GET HELP RIGHT AWAY IF:  You have a fever.  You have belly pain that is getting worse.  You feel sick to your stomach (nauseous) or you throw up (vomit).  You cannot push the hernia back in place by gently pressing on it while you are lying down.  The hernia:  Changes in shape or size.  Is stuck outside your belly.  Changes color.  Feels hard or tender.   This information is not intended to replace advice given to you by your health care provider. Make sure you discuss any questions you have with your health care provider.   Document Released:  06/15/2009 Document Revised: 01/16/2014 Document Reviewed: 11/05/2013 Elsevier Interactive Patient Education [employer]

## 2015-02-16 NOTE — ED Notes (Signed)
Patient was diagnosed with a umbilical hernia by her PCP one month ago. The PCP did not refer the patient to a surgeon as promised. Patient has been experiencing numbness to the right of her hernia for three days. Patient has not had a BM in the last two days; which, is unusual for her.

## 2015-02-19 DIAGNOSIS — K760 Fatty (change of) liver, not elsewhere classified: Secondary | ICD-10-CM | POA: Insufficient documentation

## 2015-02-24 ENCOUNTER — Other Ambulatory Visit: Payer: Self-pay

## 2015-02-24 DIAGNOSIS — K219 Gastro-esophageal reflux disease without esophagitis: Secondary | ICD-10-CM | POA: Insufficient documentation

## 2015-02-25 ENCOUNTER — Ambulatory Visit: Payer: Self-pay | Admitting: General Surgery

## 2015-03-16 ENCOUNTER — Emergency Department
Admission: EM | Admit: 2015-03-16 | Discharge: 2015-03-17 | Disposition: A | Discharge status: Home / Self Care | Payer: 59 | Attending: Emergency Medicine | Admitting: Emergency Medicine

## 2015-03-16 ENCOUNTER — Encounter: Payer: Self-pay | Admitting: Emergency Medicine

## 2015-03-16 ENCOUNTER — Emergency Department: Payer: 59

## 2015-03-16 DIAGNOSIS — Z79899 Other long term (current) drug therapy: Secondary | ICD-10-CM | POA: Diagnosis not present

## 2015-03-16 DIAGNOSIS — Z792 Long term (current) use of antibiotics: Secondary | ICD-10-CM | POA: Diagnosis not present

## 2015-03-16 DIAGNOSIS — R319 Hematuria, unspecified: Secondary | ICD-10-CM

## 2015-03-16 DIAGNOSIS — Z7984 Long term (current) use of oral hypoglycemic drugs: Secondary | ICD-10-CM | POA: Diagnosis not present

## 2015-03-16 DIAGNOSIS — E119 Type 2 diabetes mellitus without complications: Secondary | ICD-10-CM | POA: Diagnosis not present

## 2015-03-16 DIAGNOSIS — N3090 Cystitis, unspecified without hematuria: Secondary | ICD-10-CM

## 2015-03-16 DIAGNOSIS — R200 Anesthesia of skin: Secondary | ICD-10-CM | POA: Insufficient documentation

## 2015-03-16 HISTORY — DX: Zoster without complications: B02.9

## 2015-03-16 LAB — CBC
HCT: 40.9 % (ref 35.0–47.0)
Hemoglobin: 14 g/dL (ref 12.0–16.0)
MCH: 31.6 pg (ref 26.0–34.0)
MCHC: 34.2 g/dL (ref 32.0–36.0)
MCV: 92.4 fL (ref 80.0–100.0)
PLATELETS: 267 10*3/uL (ref 150–440)
RBC: 4.42 MIL/uL (ref 3.80–5.20)
RDW: 13.1 % (ref 11.5–14.5)
WBC: 11.1 10*3/uL — ABNORMAL HIGH (ref 3.6–11.0)

## 2015-03-16 LAB — BASIC METABOLIC PANEL
Anion gap: 10 (ref 5–15)
BUN: 17 mg/dL (ref 6–20)
CALCIUM: 9.9 mg/dL (ref 8.9–10.3)
CO2: 24 mmol/L (ref 22–32)
CREATININE: 0.79 mg/dL (ref 0.44–1.00)
Chloride: 110 mmol/L (ref 101–111)
GFR calc Af Amer: >60 mL/min (ref >60)
Glucose, Bld: 108 mg/dL — ABNORMAL HIGH (ref 65–99)
POTASSIUM: 4.1 mmol/L (ref 3.5–5.1)
Sodium: 144 mmol/L (ref 135–145)

## 2015-03-16 NOTE — ED Notes (Signed)
Patient given cup of water. Patient aware of need for urine sample.

## 2015-03-16 NOTE — ED Notes (Signed)
Pt has hx of umbilical hernia, today having abd pain and bloody urine. States has recently been treated for shingles but not active anymore.

## 2015-03-16 NOTE — ED Notes (Signed)
Patient transported to Ultrasound 

## 2015-03-16 NOTE — ED Notes (Signed)
Patient ambulatory to triage with steady gait, without difficulty or distress noted; pt st having hematuria this evening; denies pain, stating "no just numb, I was here 3wks ago with shingles on the right side, but now it's just numb"

## 2015-03-17 LAB — URINALYSIS COMPLETE WITH MICROSCOPIC (ARMC ONLY)
BACTERIA UA: NONE SEEN
GLUCOSE, UA: NEGATIVE mg/dL
NITRITE: NEGATIVE
PROTEIN: 100 mg/dL — AB
SPECIFIC GRAVITY, URINE: 1.023 (ref 1.005–1.030)
pH: 6 (ref 5.0–8.0)

## 2015-03-17 MED ORDER — CEPHALEXIN 500 MG PO CAPS
500.0000 mg | ORAL_CAPSULE | Freq: Once | ORAL | Status: AC
  Administered 2015-03-17: 500 mg via ORAL
  Filled 2015-03-17: qty 1

## 2015-03-17 MED ORDER — CEPHALEXIN 500 MG PO CAPS: 500.0000 mg | ORAL_CAPSULE | Freq: Four times a day (QID) | ORAL | Status: AC

## 2015-03-17 NOTE — ED Provider Notes (Signed)
[facility] Emergency Department Provider Note  ____________________________________________  Time seen: Approximately 2305 PM  I have reviewed the triage vital signs and the nursing notes.   HISTORY  Chief Complaint Hematuria    HPI Ashley Dickson is a 55 y.o. female comes into the hospital today with hematuria. The patient reports that it started this afternoon when she urinated she noticed some blood. The patient also reports that she's getting over shingles and in the area she has some numb feeling. She reports that this numbness started around February 7 and has persisted but it feels a little bit more numb today. She reports that she has been on gabapentin in her physician increased her gabapentin 2 weeks ago. The patient reports though that she was more concerned about her bloody urine. She reports that it seems more like blood tinged urine gross blood. The patient denies any pain with urination and does have a history of kidney stones. She reports that she doesn't have any pain in her kidney area but isn't sure if that numbness might be contributing. The patient denies trauma and denies any nausea or vomiting. The patient is here for evaluation.   Past Medical History  Diagnosis Date  . Diabetes mellitus without complication (HCC)   . Renal disorder   . Migraines   . Shingles     Patient Active Problem List   Diagnosis Date Noted  . Acid reflux 02/24/2015  . Controlled type 2 diabetes mellitus without complication (HCC) 01/21/2015  . Anxiety attack 12/17/2013  . Gonalgia 09/30/2013  . Allergic rhinitis 08/30/2012  . Headache, migraine 08/30/2012  . Menopausal and perimenopausal disorder 08/30/2012  . Cannot sleep 03/12/2012  . BP (high blood pressure) 02/12/2012  . Clinical depression 01/12/2012    Past Surgical History  Procedure Laterality Date  . Cholecystectomy    . Tubal ligation      Current Outpatient Rx  Name  Route  Sig   Dispense  Refill  . atorvastatin (LIPITOR) 40 MG tablet   Oral   Take 1 tablet by mouth daily.         . busPIRone (BUSPAR) 15 MG tablet   Oral   Take 15 mg by mouth 2 (two) times daily.         . Escitalopram Oxalate (LEXAPRO PO)   Oral   Take 1 tablet by mouth daily.         Marland Kitchen. etodolac (LODINE) 500 MG tablet   Oral   Take 500 mg by mouth daily.         Marland Kitchen. gabapentin (NEURONTIN) 800 MG tablet   Oral   Take 800 mg by mouth 3 (three) times daily.         Marland Kitchen. losartan (COZAAR) 25 MG tablet   Oral   Take 25 mg by mouth daily.         . metFORMIN (GLUCOPHAGE) 1000 MG tablet   Oral   Take 1,000 mg by mouth 2 (two) times daily with a meal.         . zolpidem (AMBIEN) 5 MG tablet   Oral   Take 5 mg by mouth at bedtime as needed for sleep.         . cephALEXin (KEFLEX) 500 MG capsule   Oral   Take 1 capsule (500 mg total) by mouth 4 (four) times daily.   40 capsule   0     Allergies Azithromycin; Tramadol; Ace inhibitors; and Sumatriptan  No family  history on file.  Social History Social History  Substance Use Topics  . Smoking status: Never Smoker   . Smokeless tobacco: Never Used  . Alcohol Use: Yes    Review of Systems Constitutional: No fever/chills Eyes: No visual changes. ENT: No sore throat. Cardiovascular: Denies chest pain. Respiratory: Denies shortness of breath. Gastrointestinal: No abdominal pain.  No nausea, no vomiting.  No diarrhea.  No constipation. Genitourinary: Hematuria Musculoskeletal: Negative for back pain. Skin: Negative for rash. Neurological: Numbness to right chest wall in a dermatomal distribution  10-point ROS otherwise negative.  ____________________________________________   PHYSICAL EXAM:  VITAL SIGNS: ED Triage Vitals  Enc Vitals Group     BP 03/16/15 2048 135/84 mmHg     Pulse Rate 03/16/15 2048 78     Resp 03/16/15 2048 18     Temp 03/16/15 2048 97.9 F (36.6 C)     Temp Source 03/16/15 2048 Oral      SpO2 03/16/15 2048 100 %     Weight 03/16/15 2048 228 lb (103.42 kg)     Height 03/16/15 2048  (1.676 m)     Head Cir --      Peak Flow --      Pain Score --      Pain Loc --      Pain Edu? --      Excl. in GC? --     Constitutional: Alert and oriented. Well appearing and in no acute distress. Eyes: Conjunctivae are normal. PERRL. EOMI. Head: Atraumatic. Nose: No congestion/rhinnorhea. Mouth/Throat: Mucous membranes are moist.  Oropharynx non-erythematous. Cardiovascular: Normal rate, regular rhythm. Grossly normal heart sounds.  Good peripheral circulation. Respiratory: Normal respiratory effort.  No retractions. Lungs CTAB. Gastrointestinal: Soft and nontender. No distention. No CVA tenderness. Musculoskeletal: No lower extremity tenderness nor edema.   Neurologic:  Normal speech and language.  Skin:  Skin is warm, dry and intact.  Psychiatric: Mood and affect are normal.   ____________________________________________   LABS (all labs ordered are listed, but only abnormal results are displayed)  Labs Reviewed  CBC - Abnormal; Notable for the following:    WBC 11.1 (*)    All other components within normal limits  BASIC METABOLIC PANEL - Abnormal; Notable for the following:    Glucose, Bld 108 (*)    All other components within normal limits  URINALYSIS COMPLETEWITH MICROSCOPIC ([facility] ONLY) - Abnormal; Notable for the following:    Color, Urine YELLOW (*)    APPearance CLOUDY (*)    Bilirubin Urine 1+ (*)    Ketones, ur TRACE (*)    Hgb urine dipstick 3+ (*)    Protein, ur 100 (*)    Leukocytes, UA TRACE (*)    Squamous Epithelial / LPF 0-5 (*)    All other components within normal limits  URINE CULTURE   ____________________________________________  EKG  None ____________________________________________  RADIOLOGY  Renal ultrasound: No hydronephrosis, stones on prior CT not well seen  sonographically ____________________________________________   PROCEDURES  Procedure(s) performed: None  Critical Care performed: No  ____________________________________________   INITIAL IMPRESSION / ASSESSMENT AND PLAN / ED COURSE  Pertinent labs & imaging results that were available during my care of the patient were reviewed by me and considered in my medical decision making (see chart for details).  This is a 55 year old female who comes into the hospital today with some bloody urine and numbness along her right chest wall. The patient has been dealing with this numbness since she had  her diagnosis of shingles and I feel it is a continued symptom and sequela of her shingles. I feel the patient can follow back up with her primary care physician as she is not having any extremity weakness with the symptoms. Regarding the hematuria we did do a ultrasound looking for possible hydronephrosis versus stones given the patient's history of kidney stones. The patient has had 2 CTs in the last 2 months so an effort to save radiation I performed an ultrasound. I also performed a urinalysis which showed too numerous to count white blood cells as well as red blood cells. As cystitis can produce hematuria I will give the patient a dose of Keflex and treat her for a possible and presumed cystitis. I will have the patient follow up G for further evaluation of her hematuria. ____________________________________________   FINAL CLINICAL IMPRESSION(S) / ED DIAGNOSES  Final diagnoses:  Hematuria  Cystitis      Rebecka Apley, MD 03/17/15 [phone]

## 2015-03-17 NOTE — ED Notes (Signed)
Patient back from US.

## 2015-03-17 NOTE — ED Notes (Signed)
Pt discharged to home.  Discharge instructions reviewed.  Verbalized understanding.  No questions or concerns at this time.  Teach back verified.  Pt in NAD.  No items left in ED.   

## 2015-03-17 NOTE — Discharge Instructions (Signed)
Hematuria, Adult °Hematuria is blood in your urine. It can be caused by a bladder infection, kidney infection, prostate infection, kidney stone, or cancer of your urinary tract. Infections can usually be treated with medicine, and a kidney stone usually will pass through your urine. If neither of these is the cause of your hematuria, further workup to find out the reason may be needed. °It is very important that you tell your health care provider about any blood you see in your urine, even if the blood stops without treatment or happens without causing pain. Blood in your urine that happens and then stops and then happens again can be a symptom of a very serious condition. Also, pain is not a symptom in the initial stages of many urinary cancers. °HOME CARE INSTRUCTIONS  °· Drink lots of fluid, 3-4 quarts a day. If you have been diagnosed with an infection, cranberry juice is especially recommended, in addition to large amounts of water. °· Avoid caffeine, tea, and carbonated beverages because they tend to irritate the bladder. °· Avoid alcohol because it may irritate the prostate. °· Take all medicines as directed by your health care provider. °· If you were prescribed an antibiotic medicine, finish it all even if you start to feel better. °· If you have been diagnosed with a kidney stone, follow your health care provider's instructions regarding straining your urine to catch the stone. °· Empty your bladder often. Avoid holding urine for long periods of time. °· After a bowel movement, women should cleanse front to back. Use each tissue only once. °· Empty your bladder before and after sexual intercourse if you are a female. °SEEK MEDICAL CARE IF: °· You develop back pain. °· You have a fever. °· You have a feeling of sickness in your stomach (nausea) or vomiting. °· Your symptoms are not better in 3 days. Return sooner if you are getting worse. °SEEK IMMEDIATE MEDICAL CARE IF:  °· You develop severe vomiting and  are unable to keep the medicine down. °· You develop severe back or abdominal pain despite taking your medicines. °· You begin passing a large amount of blood or clots in your urine. °· You feel extremely weak or faint, or you pass out. °MAKE SURE YOU:  °· Understand these instructions. °· Will watch your condition. °· Will get help right away if you are not doing well or get worse. °  °This information is not intended to replace advice given to you by your health care provider. Make sure you discuss any questions you have with your health care provider. °  °Document Released: 12/26/2004 Document Revised: 01/16/2014 Document Reviewed: 08/26/2012 °Elsevier Interactive Patient Education ©2016 Else[employer] Inc[employer] ° °Urinary Tract Infection °A urinary tract infection (UTI) can occur any place along the urinary tract. The tract includes the kidneys, ureters, bladder, and urethra. A type of germ called bacteria often causes a UTI. UTIs are often helped with antibiotic medicine.  °HOME CARE  °· If given, take antibiotics as told by your doctor. Finish them even if you start to feel better. °· Drink enough fluids to keep your pee (urine) clear or pale yellow. °· Avoid tea, drinks with caffeine, and bubbly (carbonated) drinks. °· Pee often. Avoid holding your pee in for a long time. °· Pee before and after having sex (intercourse). °· Wipe from front to back after you poop (bowel movement) if you are a woman. Use each tissue only once. °GET HELP RIGHT AWAY IF:  °· You have   back pain. °· You have lower belly (abdominal) pain. °· You have chills. °· You feel sick to your stomach (nauseous). °· You throw up (vomit). °· Your burning or discomfort with peeing does not go away. °· You have a fever. °· Your symptoms are not better in 3 days. °MAKE SURE YOU:  °· Understand these instructions. °· Will watch your condition. °· Will get help right away if you are not doing well or get worse. °  °This information is not intended to replace  advice given to you by your health care provider. Make sure you discuss any questions you have with your health care provider. °  °Document Released: 06/14/2007 Document Revised: 01/16/2014 Document Reviewed: 07/27/2011 °Elsevier Interactive Patient Education ©2016 [employer] Inc[employer] ° °

## 2015-03-18 LAB — URINE CULTURE

## 2015-04-13 ENCOUNTER — Ambulatory Visit: Payer: Self-pay | Admitting: Surgery

## 2015-11-23 IMAGING — MG MAM POST BIOPSY RIGHT
1 series · 2 of 2 positions shown · non-contrast
Comparison: Outside exams from [REDACTED] dated May 2013.

ADDENDUM:
Addendum by Dr. Hashemi on 07/04/13. I spoke with the patient by
telephone to discuss her biopsy results. Pathology demonstrates
fibrocystic change with PASH, without malignancy, which is
concordant with the imaging appearance. The patient reports no
problems at the biopsy site and all questions were answered. Annual
screening mammography is recommended.
CLINICAL DATA: Ultrasound-guided biopsy of a mass in the 3 o'clock
position of the right breast was performed today.

EXAM:
DIAGNOSTIC RIGHT MAMMOGRAM POST ULTRASOUND BIOPSY

[R CC · right · 2 of 2 slices shown]
[im 1/2]
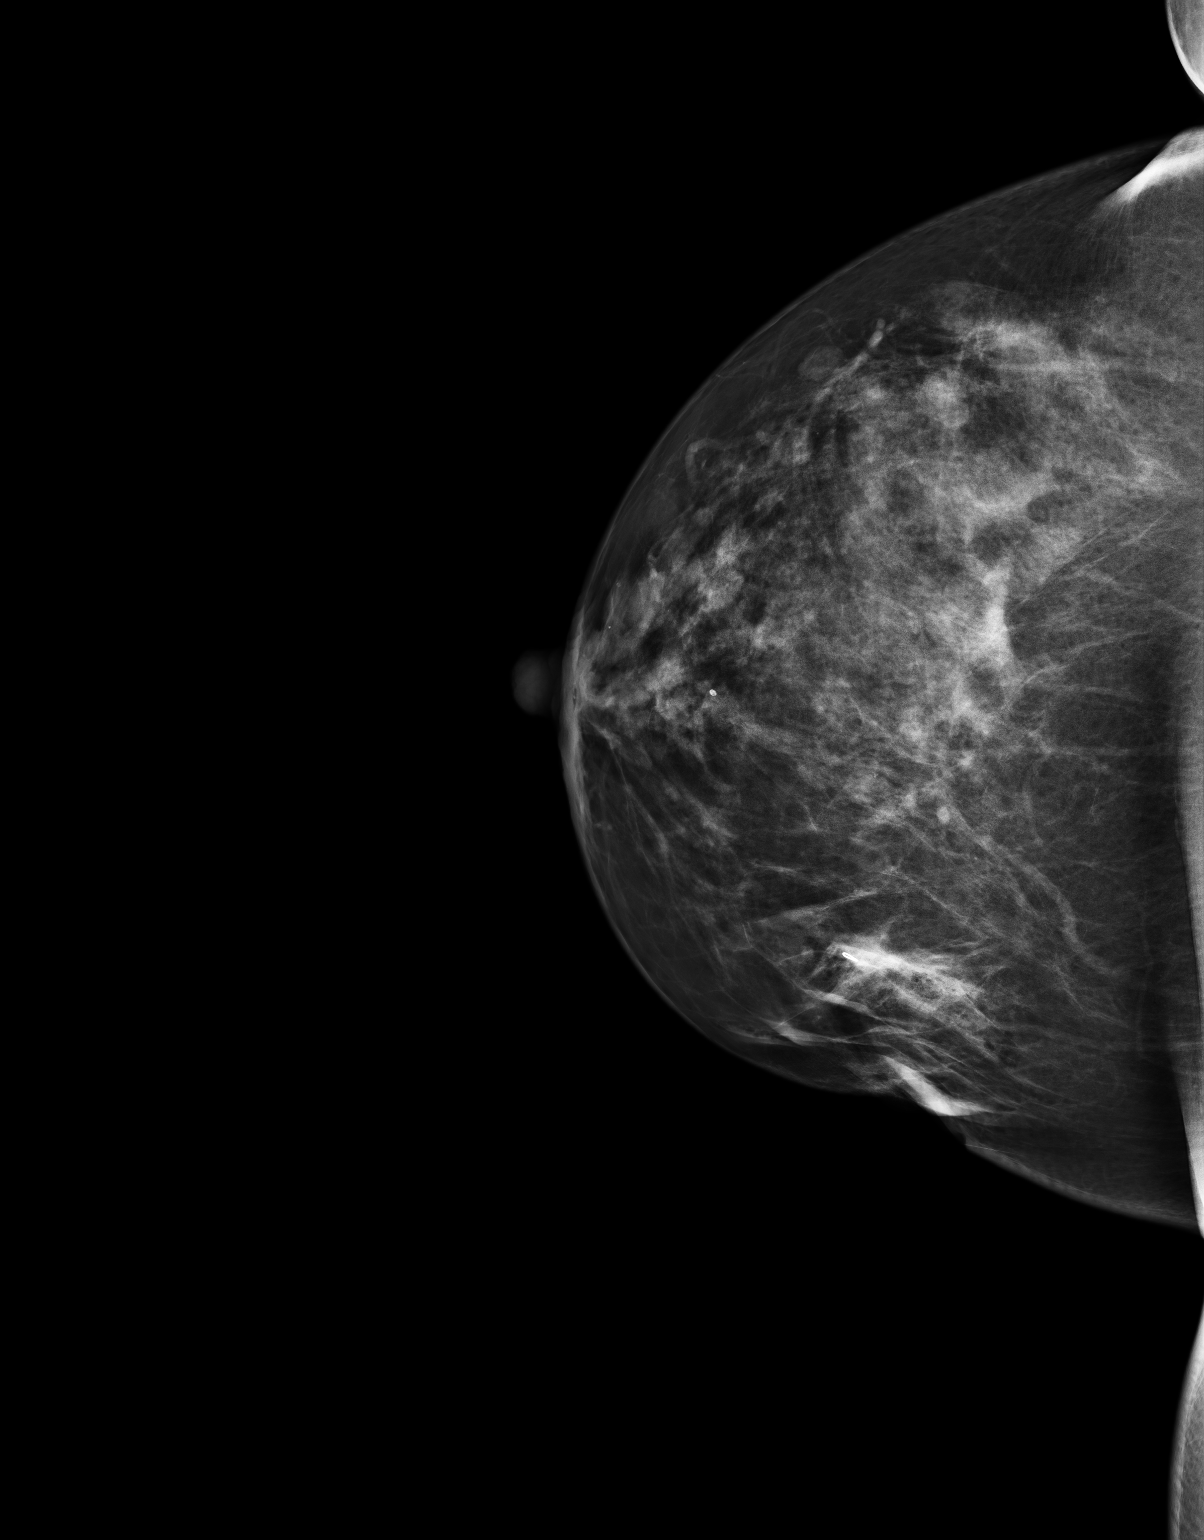
[im 2/2]
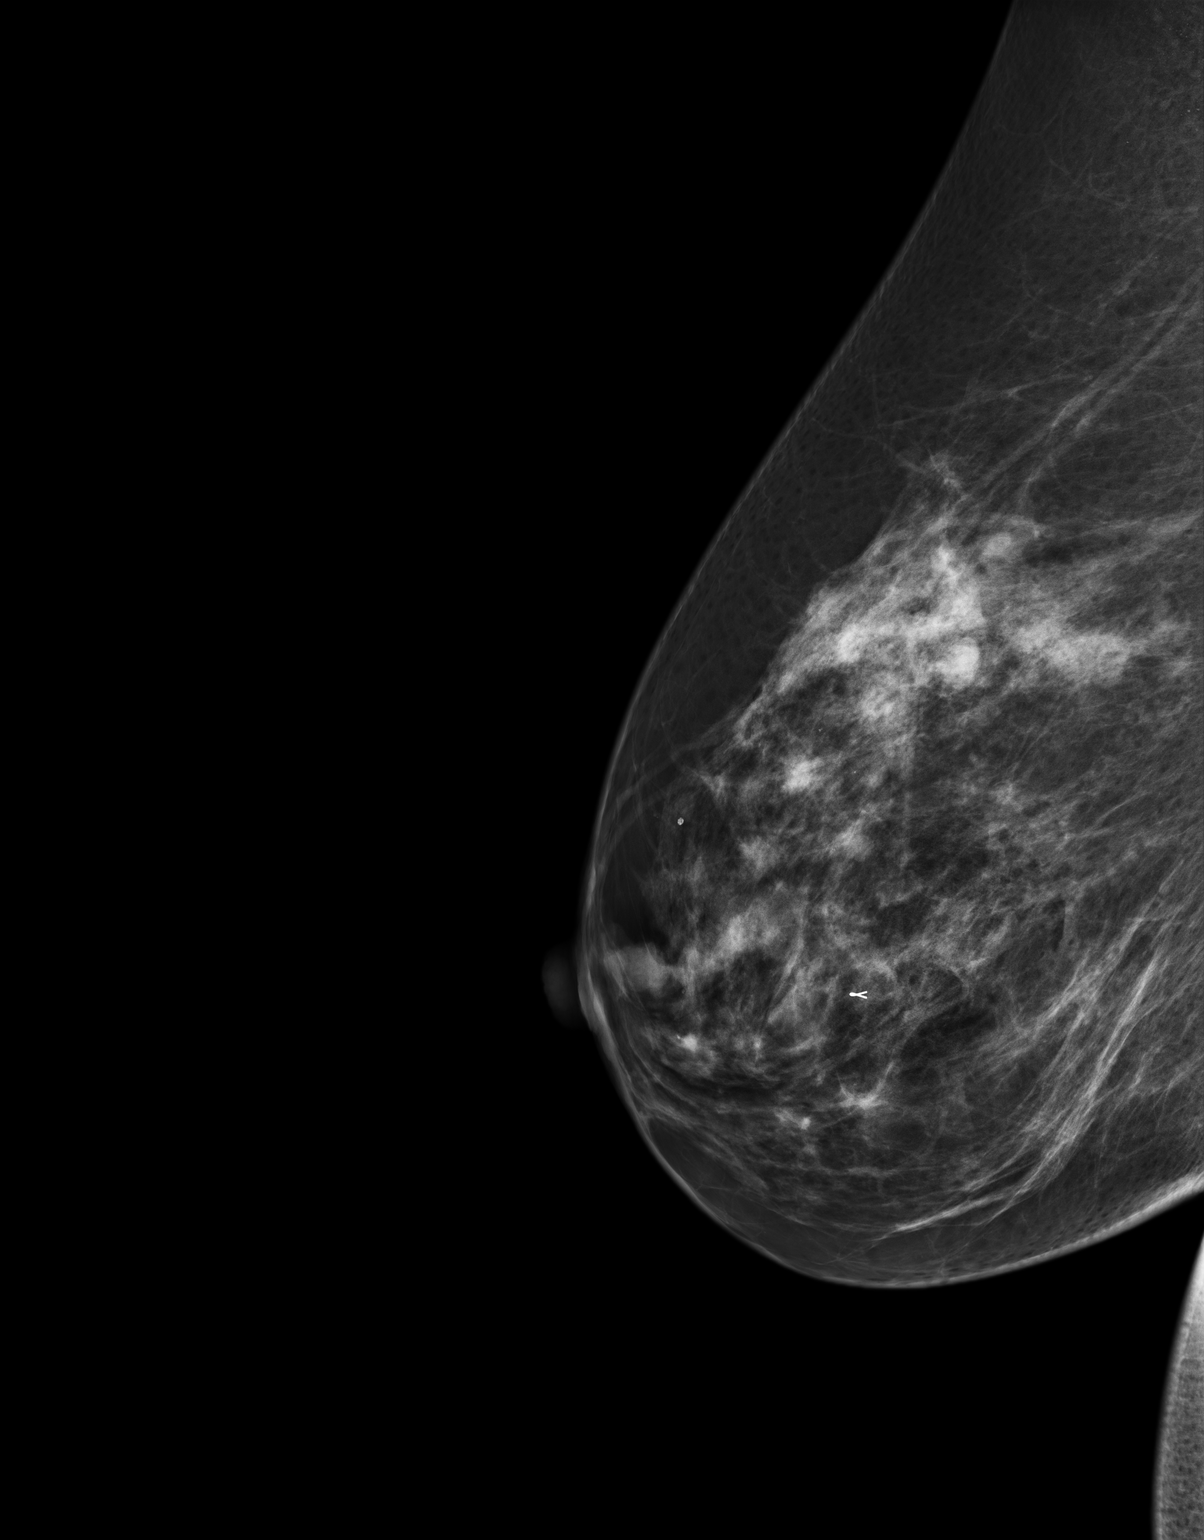

[2 of 2 positions shown; findings below may reference images not displayed]

FINDINGS: Mammographic images were obtained following ultrasound guided biopsy
of 3 o'clock position right breast mass. A ribbon shaped biopsy clip
appears satisfactorily positioned with in the biopsy cavity in the 3
o'clock region of the right breast. When compared to the recent
prior images of the right breast from May 2013, the mass in the
medial right breast appears less prominent/apparent on today's post
biopsy mammogram.
IMPRESSION: Satisfactory position of ribbon shaped biopsy clip in the 3 o'clock
position of the right breast.

Final Assessment: Post Procedure Mammograms for Marker Placement

## 2018-04-01 DIAGNOSIS — Z0189 Encounter for other specified special examinations: Secondary | ICD-10-CM | POA: Diagnosis not present

## 2018-04-01 DIAGNOSIS — I10 Essential (primary) hypertension: Secondary | ICD-10-CM | POA: Diagnosis not present

## 2018-04-01 DIAGNOSIS — E1165 Type 2 diabetes mellitus with hyperglycemia: Secondary | ICD-10-CM | POA: Diagnosis not present

## 2018-04-01 DIAGNOSIS — E782 Mixed hyperlipidemia: Secondary | ICD-10-CM | POA: Diagnosis not present

## 2018-04-01 DIAGNOSIS — G4700 Insomnia, unspecified: Secondary | ICD-10-CM | POA: Diagnosis not present

## 2018-04-08 DIAGNOSIS — Z0189 Encounter for other specified special examinations: Secondary | ICD-10-CM | POA: Diagnosis not present

## 2018-04-08 DIAGNOSIS — E1165 Type 2 diabetes mellitus with hyperglycemia: Secondary | ICD-10-CM | POA: Diagnosis not present

## 2018-04-08 DIAGNOSIS — E782 Mixed hyperlipidemia: Secondary | ICD-10-CM | POA: Diagnosis not present

## 2018-04-08 DIAGNOSIS — I10 Essential (primary) hypertension: Secondary | ICD-10-CM | POA: Diagnosis not present

## 2018-06-19 DIAGNOSIS — M9906 Segmental and somatic dysfunction of lower extremity: Secondary | ICD-10-CM | POA: Diagnosis not present

## 2018-06-19 DIAGNOSIS — M62830 Muscle spasm of back: Secondary | ICD-10-CM | POA: Diagnosis not present

## 2018-06-19 DIAGNOSIS — M9903 Segmental and somatic dysfunction of lumbar region: Secondary | ICD-10-CM | POA: Diagnosis not present

## 2018-06-19 DIAGNOSIS — M5127 Other intervertebral disc displacement, lumbosacral region: Secondary | ICD-10-CM | POA: Diagnosis not present

## 2018-06-19 DIAGNOSIS — M9904 Segmental and somatic dysfunction of sacral region: Secondary | ICD-10-CM | POA: Diagnosis not present

## 2018-07-02 ENCOUNTER — Other Ambulatory Visit: Payer: Self-pay

## 2018-07-02 ENCOUNTER — Encounter (HOSPITAL_COMMUNITY): Payer: Self-pay

## 2018-07-02 ENCOUNTER — Emergency Department (HOSPITAL_COMMUNITY): Payer: BC Managed Care – PPO

## 2018-07-02 ENCOUNTER — Emergency Department (HOSPITAL_COMMUNITY)
Admission: EM | Admit: 2018-07-02 | Discharge: 2018-07-02 | Disposition: A | Discharge status: Home / Self Care | Payer: BC Managed Care – PPO | Attending: Emergency Medicine | Admitting: Emergency Medicine

## 2018-07-02 DIAGNOSIS — M25562 Pain in left knee: Secondary | ICD-10-CM

## 2018-07-02 DIAGNOSIS — Z79899 Other long term (current) drug therapy: Secondary | ICD-10-CM | POA: Insufficient documentation

## 2018-07-02 DIAGNOSIS — M1712 Unilateral primary osteoarthritis, left knee: Secondary | ICD-10-CM | POA: Diagnosis not present

## 2018-07-02 DIAGNOSIS — Z7984 Long term (current) use of oral hypoglycemic drugs: Secondary | ICD-10-CM | POA: Diagnosis not present

## 2018-07-02 DIAGNOSIS — E119 Type 2 diabetes mellitus without complications: Secondary | ICD-10-CM | POA: Diagnosis not present

## 2018-07-02 MED ORDER — DEXAMETHASONE 4 MG PO TABS: 4.0000 mg | ORAL_TABLET | Freq: Two times a day (BID) | ORAL | 0 refills | Status: DC

## 2018-07-02 MED ORDER — DEXAMETHASONE SODIUM PHOSPHATE 10 MG/ML IJ SOLN
10.0000 mg | Freq: Once | INTRAMUSCULAR | Status: AC
  Administered 2018-07-02: 10 mg via INTRAMUSCULAR
  Filled 2018-07-02: qty 1

## 2018-07-02 MED ORDER — DICLOFENAC SODIUM 75 MG PO TBEC: 75.0000 mg | DELAYED_RELEASE_TABLET | Freq: Two times a day (BID) | ORAL | 0 refills | Status: DC

## 2018-07-02 MED ORDER — KETOROLAC TROMETHAMINE 10 MG PO TABS
10.0000 mg | ORAL_TABLET | Freq: Once | ORAL | Status: AC
  Administered 2018-07-02: 20:00:00 10 mg via ORAL
  Filled 2018-07-02: qty 1

## 2018-07-02 NOTE — ED Triage Notes (Signed)
Saturday night, pt woke up with cramps in the back of her knee. When she got up Sunday morning, pt had pain as well. Pt woke up this morning with no pain. States she was working today and felt like a rubber band popped in her knee.

## 2018-07-02 NOTE — ED Provider Notes (Signed)
[facility] EMERGENCY DEPARTMENT Provider Note   CSN: [id number] Arrival date & time: 07/02/18  1618     History   Chief Complaint Chief Complaint  Patient presents with  . Knee Pain    HPI Ashley BarrSheryl Sandoz is a 58 y.o. female.     Patient presents with 3 days of increasing left knee pain.  Today the pain felt as though a rubber band popped in her knee.  The pain is better when she is at rest, worse when she applies weight to the area.  Patient states she does a lot of bending and standing and walking at her job.  No known injury reported.  The history is provided by the patient.  Knee Pain Location:  Knee Time since incident:  3 days Injury: no   Knee location:  L knee Pain details:    Quality:  Sharp (felt like a rubber band popped)   Radiates to:  Does not radiate   Severity:  Moderate   Onset quality:  Sudden   Timing:  Intermittent   Progression:  Worsening (Pt worse with applying weight) Chronicity:  New Dislocation: no   Foreign body present:  No foreign bodies Prior injury to area:  Yes (15 yrs ago MCL tear that did not require surg.) Relieved by:  Rest Worsened by:  Bearing weight Ineffective treatments:  None tried Associated symptoms: decreased ROM   Associated symptoms: no back pain and no neck pain   Risk factors: obesity   Risk factors: no frequent fractures and no recent illness     Past Medical History:  Diagnosis Date  . Diabetes mellitus without complication (HCC)   . Migraines   . Renal disorder   . Shingles     Patient Active Problem List   Diagnosis Date Noted  . Acid reflux 02/24/2015  . Controlled type 2 diabetes mellitus without complication (HCC) 01/21/2015  . Anxiety attack 12/17/2013  . Gonalgia 09/30/2013  . Allergic rhinitis 08/30/2012  . Headache, migraine 08/30/2012  . Menopausal and perimenopausal disorder 08/30/2012  . Cannot sleep 03/12/2012  . BP (high blood pressure) 02/12/2012  . Clinical depression 01/12/2012     Past Surgical History:  Procedure Laterality Date  . CHOLECYSTECTOMY    . TUBAL LIGATION       OB History   No obstetric history on file.      Home Medications    Prior to Admission medications   Medication Sig Start Date End Date Taking? Authorizing Provider  atorvastatin (LIPITOR) 40 MG tablet Take 1 tablet by mouth daily. 01/21/15 01/21/16  [provider]  busPIRone (BUSPAR) 15 MG tablet Take 15 mg by mouth 2 (two) times daily.    [provider]  Escitalopram Oxalate (LEXAPRO PO) Take 1 tablet by mouth daily.    [provider]  etodolac (LODINE) 500 MG tablet Take 500 mg by mouth daily.    [provider]  gabapentin (NEURONTIN) 800 MG tablet Take 800 mg by mouth 3 (three) times daily.    [provider]  losartan (COZAAR) 25 MG tablet Take 25 mg by mouth daily.    [provider]  metFORMIN (GLUCOPHAGE) 1000 MG tablet Take 1,000 mg by mouth 2 (two) times daily with a meal.    [provider]  zolpidem (AMBIEN) 5 MG tablet Take 5 mg by mouth at bedtime as needed for sleep.    [provider]    Family History No family history on file.  Social  History Social History   Tobacco Use  . Smoking status: Never Smoker  . Smokeless tobacco: Never Used  Substance Use Topics  . Alcohol use: Yes  . Drug use: No     Allergies   Azithromycin, Tramadol, Ace inhibitors, and Sumatriptan   Review of Systems Review of Systems  Constitutional: Negative for activity change.       All ROS Neg except as noted in HPI  HENT: Negative for nosebleeds.   Eyes: Negative for photophobia and discharge.  Respiratory: Negative for cough, shortness of breath and wheezing.   Cardiovascular: Negative for chest pain and palpitations.  Gastrointestinal: Negative for abdominal pain and blood in stool.  Genitourinary: Negative for dysuria, frequency and hematuria.  Musculoskeletal: Positive for arthralgias. Negative for back  pain and neck pain.  Skin: Negative.   Neurological: Negative for dizziness, seizures and speech difficulty.  Psychiatric/Behavioral: Negative for confusion and hallucinations.     Physical Exam Updated Vital Signs BP 115/90 (BP Location: Right Arm)   Pulse 82   Temp 98.2 F (36.8 C) (Oral)   Resp 16   Ht 5\' 6"  (1.676 m)   Wt 104.3 kg   SpO2 100%   BMI 37.12 kg/m   Physical Exam Vitals signs and nursing note reviewed.  Constitutional:      Appearance: She is well-developed. She is not toxic-appearing.  HENT:     Head: Normocephalic.     Right Ear: Tympanic membrane and external ear normal.     Left Ear: Tympanic membrane and external ear normal.  Eyes:     General: Lids are normal.     Pupils: Pupils are equal, round, and reactive to light.  Neck:     Musculoskeletal: Normal range of motion and neck supple.     Vascular: No carotid bruit.  Cardiovascular:     Rate and Rhythm: Normal rate and regular rhythm.     Pulses: Normal pulses.     Heart sounds: Normal heart sounds.  Pulmonary:     Effort: No respiratory distress.     Breath sounds: Normal breath sounds.  Abdominal:     General: Bowel sounds are normal.     Palpations: Abdomen is soft.     Tenderness: There is no abdominal tenderness. There is no guarding.  Musculoskeletal: Normal range of motion.        General: Tenderness present.     Left knee: She exhibits no swelling, no effusion, no deformity and no erythema. Tenderness found. Medial joint line tenderness noted.       Legs:     Comments: Pain to the mid posterior knee and the posterior medial left knee. No effusion. No hot joint. Mild to moderate laxity of the joint.  Lymphadenopathy:     Head:     Right side of head: No submandibular adenopathy.     Left side of head: No submandibular adenopathy.     Cervical: No cervical adenopathy.  Skin:    General: Skin is warm and dry.  Neurological:     Mental Status: She is alert and oriented to person,  place, and time.     Cranial Nerves: No cranial nerve deficit.     Sensory: No sensory deficit.  Psychiatric:        Speech: Speech normal.      ED Treatments / Results  Labs (all labs ordered are listed, but only abnormal results are displayed) Labs Reviewed - No data to display  EKG  Radiology No results found.  Procedures Procedures (including critical care time)  Medications Ordered in ED Medications - No data to display   Initial Impression / Assessment and Plan / ED Course  I have reviewed the triage vital signs and the nursing notes.  Pertinent labs & imaging results that were available during my care of the patient were reviewed by me and considered in my medical decision making (see chart for details).          Final Clinical Impressions(s) / ED Diagnoses MDM  Vital signs within normal limits.  Pulse oximetry is 100% on room air.  Within normal limits by my interpretation.  Patient has 3 days of increasing pain in the posterior portion of the left knee.  Patient states today she felt as though a rubber band popped in the back of her knee.  This pain is worse when she is applying weight, some better when she is at rest.  X-ray of the left knee shows osteoarthritis at multiple sites.  No effusion appreciated.  No fracture or dislocation appreciated.  There are no neurovascular deficits appreciated at this time.  Wells criteria for DVT shows low risk.  Negative Homans sign, low suspicion for DVT.  Patient will be treated with knee immobilizer and crutches.  Prescription for diclofenac and Decadron given to the patient.  The patient is to use Tylenol extra strength with each meal and at bedtime.  I have asked the patient to call Dr. Metro [facility]ungH[facility] for orthopedic evaluation as soon as possible.  Patient is in agreement with this plan.   Final diagnoses:  Acute pain of left knee  Primary osteoarthritis of left knee    ED Discharge Orders          Ordered    dexamethasone (DECADRON) 4 MG tablet  2 times daily with meals     07/02/18 1834    diclofenac (VOLTAREN) 75 MG EC tablet  2 times daily     07/02/18 1834           Ivery QualeBryant, [employer], PA-C 07/02/18 1845    Bethann BerkshireZammit, Joseph, MD 07/02/18 2245

## 2018-07-02 NOTE — Discharge Instructions (Addendum)
Your x-ray shows osteoarthritis at multiple sites in your left knee.  Your history and examination question possible tear of the meniscus.  Please see Dr. Aline Brochure for further evaluation and possible MRI concerning your knee.  Please use your crutches and your knee immobilizer until seen by Dr. Aline Brochure.  You do not need to sleep in this device.  Please use Decadron and diclofenac 2 times daily with a meal.  Use two Tylenol extra strength with breakfast, lunch, dinner, and at bedtime.

## 2018-07-03 ENCOUNTER — Ambulatory Visit (INDEPENDENT_AMBULATORY_CARE_PROVIDER_SITE_OTHER): Payer: BC Managed Care – PPO | Admitting: Orthopedic Surgery

## 2018-07-03 ENCOUNTER — Encounter: Payer: Self-pay | Admitting: Orthopedic Surgery

## 2018-07-03 VITALS — BP 139/85 | HR 101 | Ht 66.0 in | Wt 219.0 lb

## 2018-07-03 DIAGNOSIS — M25562 Pain in left knee: Secondary | ICD-10-CM

## 2018-07-03 DIAGNOSIS — S83242A Other tear of medial meniscus, current injury, left knee, initial encounter: Secondary | ICD-10-CM

## 2018-07-03 MED ORDER — DICLOFENAC SODIUM 75 MG PO TBEC: 75.0000 mg | DELAYED_RELEASE_TABLET | Freq: Two times a day (BID) | ORAL | 0 refills | Status: DC

## 2018-07-03 NOTE — Progress Notes (Signed)
ER FU   NEW PROBLEM OFFICE VISIT  Chief Complaint  Patient presents with  . 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  The pain started in the left knee about 15 years ago when she had what she describes as a MCL injury which was treated with a brace at the [facility].  She says she babied her knee for 15 years and did well  Last Saturday she felt a cramp in the back of the knee got up something popped she felt acute pain.  She treated herself with ice and CBD cream and by Sunday morning she was better however on Tuesday she twisted her left knee felt another pop on the medial side of the knee with acute onset of pain swelling decreased range of motion and she presents with a locked knee with inability to extend the knee.  She went to the ER last night x-ray was negative except for some mild arthritis of the medial compartment she was given a knee immobilizer and started on Voltaren with no improvement   Review of Systems  Cardiovascular: Positive for leg swelling.  Gastrointestinal: Positive for heartburn.  Musculoskeletal: Positive for back pain and joint pain.  Endo/Heme/Allergies: Positive for environmental allergies.  Psychiatric/Behavioral: Positive for depression.  All other systems reviewed and are negative.    Past Medical History:  Diagnosis Date  . Diabetes mellitus without complication ([place])   . Migraines   . Renal disorder   . Shingles     Past Surgical History:  Procedure Laterality Date  . CHOLECYSTECTOMY    . TUBAL LIGATION      No family history on file. Social History   Tobacco Use  . Smoking status: Never Smoker  . Smokeless tobacco: Never Used  Substance Use Topics  . Alcohol use: Yes  . Drug use: No    Allergies  Allergen Reactions  . Azithromycin Anaphylaxis  . Tramadol Itching  . Ace  Inhibitors Cough  . Sumatriptan Other (See Comments)    Chest pain    Current Meds  Medication Sig  . buPROPion (WELLBUTRIN XL) 300 MG 24 hr tablet Take 300 mg by mouth daily.  Marland Kitchen dexamethasone (DECADRON) 4 MG tablet Take 1 tablet (4 mg total) by mouth 2 (two) times daily with a meal.  . diclofenac (VOLTAREN) 75 MG EC tablet Take 1 tablet (75 mg total) by mouth 2 (two) times daily.  . Escitalopram Oxalate (LEXAPRO PO) Take 1 tablet by mouth daily.  Marland Kitchen glipiZIDE (GLUCOTROL XL) 5 MG 24 hr tablet Take 10 mg by mouth.   . irbesartan (AVAPRO) 75 MG tablet Take 75 mg by mouth daily.  . [DISCONTINUED] diclofenac (VOLTAREN) 75 MG EC tablet Take 1 tablet (75 mg total) by mouth 2 (two) times daily.    BP 139/85   Pulse (!) 101   Ht 5\' 6"  (1.676 m)   Wt 219 lb (99.3 kg)   BMI 35.35 kg/m   Physical Exam Vitals signs and nursing note reviewed.  Constitutional:      Appearance: Normal appearance.  Neurological:     Mental Status: She is alert and oriented to person, place, and time.  Psychiatric:        Mood and Affect: Mood normal.     Ortho Exam  Right knee seems normal with normal  range of motion no instability excellent strength neurovascular exam intact skin looks great alignment is slight valgus  Left knee is locked in extension she is lacking 15 degrees of extension I was able to forcefully bend her knee to 90 degrees it was painful she had tenderness over the medial joint line the collateral ligaments and anterior cruciate and posterior cruciate ligaments were stable to varus valgus stress testing  Neurovascular exam was intact skin was normal  MEDICAL DECISION SECTION  ER data indicates after review of the records that she indeed was given dexamethasone and diclofenac crutches and told to follow-up with orthopedics  [facility]  My independent reading of xrays:  No fracture dislocation on the for x-rays that are seen she does have some mild  arthritis of the medial compartment with joint space narrowing  Encounter Diagnoses  Name Primary?  . Acute pain of left knee   . Acute medial meniscus tear of left knee, initial encounter Yes    PLAN: (Rx., injectx, surgery, frx, mri/ct) Recommend ibuprofen ice Voltaren rest  MRI left knee medial meniscus tear  Meds ordered this encounter  Medications  . diclofenac (VOLTAREN) 75 MG EC tablet    Sig: Take 1 tablet (75 mg total) by mouth 2 (two) times daily.    Dispense:  60 tablet    Refill:  0    Fuller CanadaStanley Kamdyn Covel, MD  07/03/2018 4:45 PM

## 2018-07-03 NOTE — Patient Instructions (Addendum)
OOW 29 June thru July 6th  I think you have a meniscus tear    Meniscus Tear  A meniscus tear is a knee injury that happens when a piece of the meniscus is torn. The meniscus is a thick, rubbery, wedge-shaped cartilage in the knee. Two menisci are located in each knee. They sit between the upper bone (femur) and lower bone (tibia) that make up the knee joint. Each meniscus acts as a shock absorber for the knee. A torn meniscus is one of the most common types of knee injuries. This injury can range from mild to severe. Surgery may be needed to repair a severe tear. What are the causes? This condition may be caused by any kneeling, squatting, twisting, or pivoting movement. Sports-related injuries are the most common cause. These often occur from:  Running and stopping suddenly. ? Changing direction. ? Being tackled or knocked off your feet.  Lifting or carrying heavy weights. As people get older, their menisci get thinner and weaker. In these people, tears can happen more easily, such as from climbing stairs. What increases the risk? You are more likely to develop this condition if you:  Play contact sports.  Have a job that requires kneeling or squatting.  Are female.  Are over 56 years old. What are the signs or symptoms? Symptoms of this condition include:  Knee pain, especially at the side of the knee joint. You may feel pain when the injury occurs, or you may only hear a pop and feel pain later.  A feeling that your knee is clicking, catching, locking, or giving way.  Not being able to fully bend or extend your knee.  Bruising or swelling in your knee. How is this diagnosed? This condition may be diagnosed based on your symptoms and a physical exam. You may also have tests, such as:  X-rays.  MRI.  A procedure to look inside your knee with a narrow surgical telescope (arthroscopy). You may be referred to a knee specialist (orthopedic surgeon). How is this  treated? Treatment for this injury depends on the severity of the tear. Treatment for a mild tear may include:  Rest.  Medicine to reduce pain and swelling. This is usually a nonsteroidal anti-inflammatory drug (NSAID), like ibuprofen.  A knee brace, sleeve, or wrap.  Using crutches or a walker to keep weight off your knee and to help you walk.  Exercises to strengthen your knee (physical therapy). You may need surgery if you have a severe tear or if other treatments are not working. Follow these instructions at home: If you have a brace, sleeve, or wrap:  Wear it as told by your health care provider. Remove it only as told by your health care provider.  Loosen the brace, sleeve, or wrap if your toes tingle, become numb, or turn cold and blue.  Keep the brace, sleeve, or wrap clean and dry.  If the brace, sleeve, or wrap is not waterproof: ? Do not let it get wet. ? Cover it with a watertight covering when you take a bath or shower. Managing pain and swelling   Take over-the-counter and prescription medicines only as told by your health care provider.  If directed, put ice on your knee: ? If you have a removable brace, sleeve, or wrap, remove it as told by your health care provider. ? Put ice in a plastic bag. ? Place a towel between your skin and the bag. ? Leave the ice on for 20 minutes, 2-3  times per day.  Move your toes often to avoid stiffness and to lessen swelling.  Raise (elevate) the injured area above the level of your heart while you are sitting or lying down. Activity  Do not use the injured limb to support your body weight until your health care provider says that you can. Use crutches or a walker as told by your health care provider.  Return to your normal activities as told by your health care provider. Ask your health care provider what activities are safe for you.  Perform range-of-motion exercises only as told by your health care provider.  Begin  doing exercises to strengthen your knee and leg muscles only as told by your health care provider. After you recover, your health care provider may recommend these exercises to help prevent another injury. General instructions  Use a knee brace, sleeve, or wrap as told by your health care provider.  Ask your health care provider when it is safe to drive if you have a brace, sleeve, or wrap on your knee.  Do not use any products that contain nicotine or tobacco, such as cigarettes, e-cigarettes, and chewing tobacco. If you need help quitting, ask your health care provider.  Ask your health care provider if the medicine prescribed to you: ? Requires you to avoid driving or using heavy machinery. ? Can cause constipation. You may need to take these actions to prevent or treat constipation:  Drink enough fluid to keep your urine pale yellow.  Take over-the-counter or prescription medicines.  Eat foods that are high in fiber, such as beans, whole grains, and fresh fruits and vegetables.  Limit foods that are high in fat and processed sugars, such as fried or sweet foods.  Keep all follow-up visits as told by your health care provider. This is important. Contact a health care provider if:  You have a fever.  Your knee becomes red, tender, or swollen.  Your pain medicine is not helping.  Your symptoms get worse or do not improve after 2 weeks of home care. Summary  A meniscus tear is a knee injury that happens when a piece of the meniscus is torn.  Treatment for this injury depends on the severity of the tear. You may need surgery if you have a severe tear or if other treatments are not working.  Rest, ice, and raise (elevate) your injured knee as told by your health care provider. This will help lessen pain and swelling.  Contact a health care provider if you have new symptoms, or your symptoms get worse or do not improve after 2 weeks of home care.  Keep all follow-up visits as  told by your health care provider. This is important. This information is not intended to replace advice given to you by your health care provider. Make sure you discuss any questions you have with your health care provider. Document Released: 03/18/2002 Document Revised: 07/10/2017 Document Reviewed: 07/10/2017 Elsevier Interactive Patient Education  2019 [employer].

## 2018-07-08 ENCOUNTER — Telehealth: Payer: Self-pay

## 2018-07-08 DIAGNOSIS — M25562 Pain in left knee: Secondary | ICD-10-CM

## 2018-07-08 NOTE — Telephone Encounter (Signed)
It is in review with her insurance

## 2018-07-08 NOTE — Telephone Encounter (Signed)
Patient called - relayed per Amy's response. Voiced understanding* *States would have MRI in [place] if this would be sooner availability.

## 2018-07-08 NOTE — Telephone Encounter (Signed)
Patient called asking about her MRI appointment. Asked for the nurse to give her a call with date and time.  Please call and advise

## 2018-07-09 NOTE — Telephone Encounter (Signed)
To you FYI, her insurance will not cover MRI w/o 6 weeks of conservative treatment first. PT order sent.

## 2018-07-09 NOTE — Telephone Encounter (Signed)
Her insurance has declined to approve MRI needs conservative treatment first, referral to PT  Will call patient to advise

## 2018-07-11 DIAGNOSIS — E1165 Type 2 diabetes mellitus with hyperglycemia: Secondary | ICD-10-CM | POA: Diagnosis not present

## 2018-07-11 DIAGNOSIS — F331 Major depressive disorder, recurrent, moderate: Secondary | ICD-10-CM | POA: Diagnosis not present

## 2018-07-11 DIAGNOSIS — M1990 Unspecified osteoarthritis, unspecified site: Secondary | ICD-10-CM | POA: Diagnosis not present

## 2018-07-11 DIAGNOSIS — G4700 Insomnia, unspecified: Secondary | ICD-10-CM | POA: Diagnosis not present

## 2018-07-11 DIAGNOSIS — R109 Unspecified abdominal pain: Secondary | ICD-10-CM | POA: Diagnosis not present

## 2018-07-15 ENCOUNTER — Other Ambulatory Visit: Payer: Self-pay

## 2018-07-15 ENCOUNTER — Other Ambulatory Visit: Payer: Self-pay | Admitting: Internal Medicine

## 2018-07-15 ENCOUNTER — Ambulatory Visit (HOSPITAL_COMMUNITY)
Admission: RE | Admit: 2018-07-15 | Discharge: 2018-07-15 | Disposition: A | Discharge status: Home / Self Care | Payer: BC Managed Care – PPO | Source: Ambulatory Visit | Attending: Internal Medicine | Admitting: Internal Medicine

## 2018-07-15 DIAGNOSIS — R1084 Generalized abdominal pain: Secondary | ICD-10-CM | POA: Insufficient documentation

## 2018-07-15 DIAGNOSIS — R109 Unspecified abdominal pain: Secondary | ICD-10-CM | POA: Diagnosis not present

## 2018-07-15 DIAGNOSIS — K760 Fatty (change of) liver, not elsewhere classified: Secondary | ICD-10-CM | POA: Diagnosis not present

## 2018-07-15 DIAGNOSIS — N200 Calculus of kidney: Secondary | ICD-10-CM | POA: Diagnosis not present

## 2018-07-15 MED ORDER — IOHEXOL 300 MG/ML  SOLN
80.0000 mL | Freq: Once | INTRAMUSCULAR | Status: AC | PRN
  Administered 2018-07-15: 18:00:00 80 mL via INTRAVENOUS

## 2018-07-16 DIAGNOSIS — K1370 Unspecified lesions of oral mucosa: Secondary | ICD-10-CM | POA: Diagnosis not present

## 2018-07-16 DIAGNOSIS — R109 Unspecified abdominal pain: Secondary | ICD-10-CM | POA: Diagnosis not present

## 2018-07-16 DIAGNOSIS — E1165 Type 2 diabetes mellitus with hyperglycemia: Secondary | ICD-10-CM | POA: Diagnosis not present

## 2018-07-17 ENCOUNTER — Other Ambulatory Visit: Payer: Self-pay

## 2018-07-17 ENCOUNTER — Encounter

## 2018-07-17 ENCOUNTER — Ambulatory Visit (HOSPITAL_COMMUNITY): Payer: BC Managed Care – PPO | Attending: Orthopedic Surgery

## 2018-07-17 ENCOUNTER — Encounter (HOSPITAL_COMMUNITY): Payer: Self-pay

## 2018-07-17 DIAGNOSIS — R29898 Other symptoms and signs involving the musculoskeletal system: Secondary | ICD-10-CM | POA: Diagnosis not present

## 2018-07-17 DIAGNOSIS — M6281 Muscle weakness (generalized): Secondary | ICD-10-CM | POA: Diagnosis not present

## 2018-07-17 DIAGNOSIS — M25562 Pain in left knee: Secondary | ICD-10-CM | POA: Diagnosis not present

## 2018-07-17 NOTE — Therapy (Signed)
[facility] [address] [place], [place], [address] Phone: [phone]   Fax:  [phone]  Physical Therapy Evaluation  Patient Details  Name: Ashley Dickson MRN: [id number] Date of Birth: 05/19/1960 Referring Provider (PT): Alla GermanStanley L Harrison, MD   Encounter Date: 07/17/2018  PT End of Session - 07/17/18 1111    Visit Number  1    Number of Visits  12    Date for PT Re-Evaluation  08/28/18   Mini reasses 08/07/18   Authorization Type  [employer] PPO    Authorization Time Period  07/17/18 tp 08/28/18    PT Start Time  1016    PT Stop Time  1101    PT Time Calculation (min)  45 min    Activity Tolerance  Patient tolerated treatment well    Behavior During Therapy  WakemedWFL for tasks assessed/performed       Past Medical History:  Diagnosis Date  . Diabetes mellitus without complication (HCC)   . Migraines   . Renal disorder   . Shingles     Past Surgical History:  Procedure Laterality Date  . CHOLECYSTECTOMY    . TUBAL LIGATION      There were no vitals filed for this visit.   Subjective Assessment - 07/17/18 1019    Subjective  Pt reports pain in L knee started appropximately 3 weeks ago, during Saturday night she woke up with terrible cramp in back of knee (not calf) and when trying to stretch it out she felt something pop. Pt reports going to work the next 2 days in a row, Teusday she turned her trunk  but not her legs and felt a rubber band pop and was unable to put pressure on it so went to ER. Pt reports x-ray at hospital that was negative and doctor wants MRI. but insurance requires PT first. Pt no longer taking voltaren due to allergic reaction. Pt reports ER gave her a full leg brace, Dr. Romeo AppleHarrison removed it and pt had an older L knee brace that she has been wearing majority of the time when at work and on her feet to add stability. Pt reports "today I am really sore" and reports working 7 days in a row, 12 hr shifts on her feet.  Pt reports old MCL injury on L knee 10-12 years ago without any surgical intervention or issues. Pt denies falls, knee buckling. Pt difficulty with standing, walking, avoiding bending, putting on shoes, lifting laundry. Pt reports bending knee and sleeping on side provides relief. Pt reports having a SPC in car in case she needs it, but has never had to use it.    Limitations  Lifting;Standing;Walking;House hold activities;Other (comment)   work   How long can you sit comfortably?  no issues    How long can you stand comfortably?  2 hours    How long can you walk comfortably?  20-30 minutes    Diagnostic tests  x-ray (negative)    Patient Stated Goals  to not hurt, not have surgery    Currently in Pain?  Yes    Pain Score  9     Pain Location  Knee    Pain Orientation  Left    Pain Descriptors / Indicators  Burning;Sharp   a stretching rubberband   Pain Type  Acute pain    Pain Radiating Towards  none    Pain Onset  1 to 4 weeks ago    Pain Frequency  Constant    Aggravating Factors   standing, walking, weighted bending, lifting objects, rising from sitting    Pain Relieving Factors  bent posture, sleeping on side    Effect of Pain on Daily Activities  increased         [facility] PT Assessment - 07/17/18 0001      Assessment   Medical Diagnosis  acute pain of L knee    Referring Provider (PT)  Alla German, MD    Onset Date/Surgical Date  06/29/18    Next MD Visit  none scheduled    Prior Therapy  no      Precautions   Precautions  None    Required Braces or Orthoses  --   none prescribed by MD     Restrictions   Weight Bearing Restrictions  No      Balance Screen   Has the patient fallen in the past 6 months  No    Has the patient had a decrease in activity level because of a fear of falling?   No    Is the patient reluctant to leave their home because of a fear of falling?   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), on feet, occasional weighted carry    Leisure  Sleep, play with grandsons      Observation/Other Assessments   Focus on Therapeutic Outcomes (FOTO)   to be completed next time      Sensation   Light Touch  Appears Intact      Functional Tests   Functional tests  Sit to Stand      Sit to Stand   Comments  30 sec STS: 10 reps   no UE; increased pain/burning in L knee     ROM / Strength   AROM / PROM / Strength  AROM;Strength      AROM   AROM Assessment Site  Knee    Right/Left Knee  Left    Left Knee Extension  12   from extension   Left Knee Flexion  120      Strength   Strength Assessment Site  Hip;Knee;Ankle    Right Hip Flexion  3+/5    Right Hip Extension  3+/5    Right Hip ABduction  4/5    Left Hip Flexion  3+/5    Left Hip Extension  3+/5   pain in posterior L knee   Left Hip ABduction  4/5   pain in posterior L knee   Right Knee Flexion  4+/5    Right Knee Extension  5/5    Left Knee Flexion  4+/5    Left Knee Extension  5/5    Right Ankle Dorsiflexion  5/5    Left Ankle Dorsiflexion  5/5      Flexibility   Soft Tissue Assessment /Muscle Length  yes    Hamstrings  90/90: WNL bil    ITB  Ober: L positive    Piriformis  WNL L      Palpation   Patella mobility  R: good mobility lateral/medial and superior/inferior; L: good mobility medial/lateral, hypomobile superior/inferior   Pain with L patella lateral mob on at medial joint line   Palpation comment  L: Tenderness at medial joint line, posterior need medial side, painfree on lateral side  Special Tests    Special Tests  Meniscus Tests    Meniscus Tests  McMurray Test;other      McMurray Test   Findings  Negative    Side  Left      other   Findings  Positive    Side  Left    Comments  Thessaly      Ambulation/Gait   Assistive device  None    Gait Pattern  Step-through pattern;Left flexed knee in  stance    Gait Comments  first 2-3 steps after rising from seated antalgic, improving to fluid progression with equal bil step length      Balance   Balance Assessed  Yes      Static Standing Balance   Static Standing - Balance Support  No upper extremity supported    Static Standing Balance -  Activities   Single Leg Stance - Right Leg;Single Leg Stance - Left Leg    Static Standing - Comment/# of Minutes  R: 16 sec, L: 4 sec      Functional Gait  Assessment   Gait assessed   Yes    Gait Level Surface  Walks 20 ft in less than 7 sec but greater than 5.5 sec, uses assistive device, slower speed, mild gait deviations, or deviates 6-10 in outside of the 12 in walkway width.    Change in Gait Speed  Makes only minor adjustments to walking speed, or accomplishes a change in speed with significant gait deviations, deviates 10-15 in outside the 12 in walkway width, or changes speed but loses balance but is able to recover and continue walking.    Gait with Horizontal Head Turns  Performs head turns smoothly with slight change in gait velocity (eg, minor disruption to smooth gait path), deviates 6-10 in outside 12 in walkway width, or uses an assistive device.    Gait with Vertical Head Turns  Performs task with slight change in gait velocity (eg, minor disruption to smooth gait path), deviates 6 - 10 in outside 12 in walkway width or uses assistive device    Gait and Pivot Turn  Pivot turns safely within 3 sec and stops quickly with no loss of balance.    Step Over Obstacle  Is able to step over one shoe box (4.5 in total height) without changing gait speed. No evidence of imbalance.    Gait with Narrow Base of Support  Ambulates less than 4 steps heel to toe or cannot perform without assistance.    Gait with Eyes Closed  Walks 20 ft, slow speed, abnormal gait pattern, evidence for imbalance, deviates 10-15 in outside 12 in walkway width. Requires more than 9 sec to ambulate 20 ft.    Ambulating  Backwards  Walks 20 ft, uses assistive device, slower speed, mild gait deviations, deviates 6-10 in outside 12 in walkway width.    Steps  Two feet to a stair, must use rail.   ascent: alternating with rail; descent: 2 feet with rail   Total Score  16    FGA comment:  no pain reported          Objective measurements completed on examination: See above findings.      PT Education - 07/17/18 1121    Education Details  Assessment findings, POC, initiated HEP    Person(s) Educated  Patient    Methods  Explanation;Demonstration;Handout    Comprehension  Verbalized understanding       PT Short Term Goals - 07/17/18  1148      PT SHORT TERM GOAL #1   Title  Pt will report 5/10 pain at worst and 3-4 seated rest breaks during 12 hour shift to return to improve abilities to complete work related tasks.    Time  3    Period  Weeks    Status  New    Target Date  08/07/18      PT SHORT TERM GOAL #2   Title  Pt will be independent with HEP to maximize functional mobility and maintain gains made with skilled PT interventions.    Time  3    Period  Weeks    Status  New      PT SHORT TERM GOAL #3   Title  Pt will improve FGA score to 22/30 or > to reduce risk for falls with gait activities at home and in the workplace.    Time  3    Period  Weeks    Status  New        PT Long Term Goals - 07/17/18 1150      PT LONG TERM GOAL #1   Title  Pt will report 2/10 pain at worst and 1-2 seated rest breaks during 12 hour shift to return to improve abilities to complete work related tasks.    Time  6    Period  Weeks    Status  New    Target Date  08/28/18      PT LONG TERM GOAL #2   Title  Pt with improve BLE strength by 1 grade to demo improved gait mechanics and reduced pain with functional activity.    Time  6    Period  Weeks    Status  New      PT LONG TERM GOAL #3   Title  Pt will improve bil SLS score to 30 sec or > to reduce risk for falls.    Time  6    Period  Weeks     Status  New      PT LONG TERM GOAL #4   Title  Pt will perform L knee AROM to 0-120, without pain to demo increased strength, reduced pain, and improved gait mechanics on level ground and with stairs.    Time  6    Period  Weeks    Status  New         Plan - 07/17/18 1112    Clinical Impression Statement  Pt is a pleasant 58YP female with complaints of L knee pain with walking, standing and possible meniscal injury per MD note. Pt demonstrates decreased L knee AROM with 12 degrees from extension and 120 degrees of flexion; reporting pain with extension. Pt verbalizes pain along medial joint line and throughout posterior knee joint on the medial side. As noted above, pt with weakness throughout BLE, with L weaker than R. Pt demonstrates decreased balance per SLS test, only able to stand on LLE u pto 4 sec and FGA test scoring 16/30 indicating increased risk for falls. Pt with negative McMurray's test for medial and lateral meniscus on L knee and positive Thessaly's test for LLE indicating possible meniscal damage. Pt demonstrates initial unsteadiness with first 2-3 steps after rising from sitting due to pain, but progresses to step through, fluid gait progression with equal bil step length and stance time. Pt has good hamstring flexibility noted in 90/90 position bil and + Ober's on L indicating tight IT band, denies pain  with all flexibility testing. Pt is motivated and active, reporting high pain tolerance, and wanting to avoid surgical intervention demonstrating good rehab potential. Pt would benefit from skilled PT to improve deficits noted above and improve strength, AROM, balance, and overall ability to perform functional activities to improve QOL and return to PLOF.    Personal Factors and Comorbidities  Age;Comorbidity 1;Time since onset of injury/illness/exacerbation    Comorbidities  L MCL tear 10+ years ago, no internvention and no difficulties    Examination-Activity Limitations   Bend;Carry;Dressing;Squat;Stand    Examination-Participation Restrictions  Other;Cleaning   work   Designer, jewellery  Low    Rehab Potential  Good    PT Frequency  2x / week    PT Duration  6 weeks    PT Treatment/Interventions  ADLs/Self Care Home Management;Aquatic Therapy;Biofeedback;Cryotherapy;Electrical Stimulation;Moist Heat;Ultrasound;Gait training;Stair training;Functional mobility training;Therapeutic activities;Therapeutic exercise;Balance training;Neuromuscular re-education;Patient/family education;Orthotic Fit/Training;Manual techniques;Passive range of motion;Dry needling;Taping;Joint Manipulations    PT Next Visit Plan  Complete FOTO, review goals. Initiate strengthening throughout BLE hips, L quad/hamstring, standing balance activities. Manual STM and joint mobs as needed for pain relief.    PT Home Exercise Plan  Eval: quad set, sidelying hip abd, sidelying clams    Consulted and Agree with Plan of Care  Patient       Patient will benefit from skilled therapeutic intervention in order to improve the following deficits and impairments:  Abnormal gait, Decreased balance, Decreased range of motion, Decreased strength, Difficulty walking, Hypomobility, Impaired flexibility, Pain  Visit Diagnosis: 1. Acute pain of left knee   2. Muscle weakness (generalized)   3. Other symptoms and signs involving the musculoskeletal system        Problem List Patient Active Problem List   Diagnosis Date Noted  . Acid reflux 02/24/2015  . Controlled type 2 diabetes mellitus without complication ([place]) 96/75/9163  . Anxiety attack 12/17/2013  . Gonalgia 09/30/2013  . Allergic rhinitis 08/30/2012  . Headache, migraine 08/30/2012  . Menopausal and perimenopausal disorder 08/30/2012  . Cannot sleep 03/12/2012  . BP (high blood pressure) 02/12/2012  . Clinical depression 01/12/2012     Talbot Grumbling PT, DPT  [facility] [address] [place], [place], [address] Phone: [phone]   Fax:  [phone]  Name: Ashley Dickson MRN: [id number] Date of Birth: 1960/11/29

## 2018-07-23 ENCOUNTER — Ambulatory Visit (HOSPITAL_COMMUNITY): Payer: BC Managed Care – PPO | Admitting: Physical Therapy

## 2018-07-23 NOTE — Telephone Encounter (Signed)
pt called to cx said she couldn't be here today  07/23/18

## 2018-07-25 ENCOUNTER — Ambulatory Visit (HOSPITAL_COMMUNITY): Payer: BC Managed Care – PPO

## 2018-07-25 ENCOUNTER — Encounter (HOSPITAL_COMMUNITY): Payer: Self-pay

## 2018-07-25 ENCOUNTER — Other Ambulatory Visit: Payer: Self-pay

## 2018-07-25 DIAGNOSIS — M25562 Pain in left knee: Secondary | ICD-10-CM

## 2018-07-25 DIAGNOSIS — M6281 Muscle weakness (generalized): Secondary | ICD-10-CM | POA: Diagnosis not present

## 2018-07-25 DIAGNOSIS — R29898 Other symptoms and signs involving the musculoskeletal system: Secondary | ICD-10-CM | POA: Diagnosis not present

## 2018-07-25 NOTE — Therapy (Signed)
[facility] [address] [place], [place], [address] Phone: [phone]   Fax:  [phone]  Physical Therapy Treatment  Patient Details  Name: Ashley Dickson MRN: [id number] Date of Birth: 07/26/1960 Referring Provider (PT): Alla GermanStanley L Harrison, MD   Encounter Date: 07/25/2018  PT End of Session - 07/25/18 1634    Visit Number  2    Number of Visits  12    Date for PT Re-Evaluation  08/28/18   Minireassess 08/07/18   Authorization Type  [employer] PPO    Authorization Time Period  07/17/18 to 08/28/18    PT Start Time  1616    PT Stop Time  1700    PT Time Calculation (min)  44 min    Activity Tolerance  Patient tolerated treatment well    Behavior During Therapy  [facility] for tasks assessed/performed       Past Medical History:  Diagnosis Date  . Diabetes mellitus without complication (HCC)   . Migraines   . Renal disorder   . Shingles     Past Surgical History:  Procedure Laterality Date  . CHOLECYSTECTOMY    . TUBAL LIGATION      There were no vitals filed for this visit.  Subjective Assessment - 07/25/18 1613    Subjective  Pt stated she burning, sharp intermittent pain in back and medial aspect of Lt knee, pain scale 6/10.  Reports she has began HEP every other day.  Continues to work 10-12 hour days on her feet all day.    Currently in Pain?  Yes    Pain Score  6     Pain Location  Knee    Pain Orientation  Left;Medial;Other (Comment)   posterior   Pain Descriptors / Indicators  Burning;Sharp    Pain Type  Acute pain    Pain Onset  1 to 4 weeks ago    Pain Frequency  Constant    Aggravating Factors   standing, walking, weighted bending, lifting objects, rising from sitting    Pain Relieving Factors  bent posture, sleeping on side    Effect of Pain on Daily Activities  increased                       [facility] Adult PT Treatment/Exercise - 07/25/18 0001      Exercises   Exercises  Knee/Hip      Knee/Hip  Exercises: Seated   Sit to Sand  10 reps;without UE support   eccentric control     Knee/Hip Exercises: Supine   Quad Sets  10 reps    Short Arc Quad Sets  10 reps    Bridges  10 reps    Bridges Limitations  5" holds      Knee/Hip Exercises: Sidelying   Hip ABduction  Both;10 reps    Clams  BLE 10x 5" holds with RTB      Manual Therapy   Manual Therapy  Edema management  (Pended)              PT Education - 07/25/18 1630    Education Details  Reviewed goals, assured compliance with HEP.  Pt unable to recall exercises though able to demonstrate appropriate form with HEP.  FOTO complete 60% limitation    Person(s) Educated  Patient    Methods  Explanation;Demonstration    Comprehension  Verbalized understanding       PT Short Term Goals - 07/17/18 1148  PT SHORT TERM GOAL #1   Title  Pt will report 5/10 pain at worst and 3-4 seated rest breaks during 12 hour shift to return to improve abilities to complete work related tasks.    Time  3    Period  Weeks    Status  New    Target Date  08/07/18      PT SHORT TERM GOAL #2   Title  Pt will be independent with HEP to maximize functional mobility and maintain gains made with skilled PT interventions.    Time  3    Period  Weeks    Status  New      PT SHORT TERM GOAL #3   Title  Pt will improve FGA score to 22/30 or > to reduce risk for falls with gait activities at home and in the workplace.    Time  3    Period  Weeks    Status  New        PT Long Term Goals - 07/17/18 1150      PT LONG TERM GOAL #1   Title  Pt will report 2/10 pain at worst and 1-2 seated rest breaks during 12 hour shift to return to improve abilities to complete work related tasks.    Time  6    Period  Weeks    Status  New    Target Date  08/28/18      PT LONG TERM GOAL #2   Title  Pt with improve BLE strength by 1 grade to demo improved gait mechanics and reduced pain with functional activity.    Time  6    Period  Weeks     Status  New      PT LONG TERM GOAL #3   Title  Pt will improve bil SLS score to 30 sec or > to reduce risk for falls.    Time  6    Period  Weeks    Status  New      PT LONG TERM GOAL #4   Title  Pt will perform L knee AROM to 0-120, without pain to demo increased strength, reduced pain, and improved gait mechanics on level ground and with stairs.    Time  6    Period  Weeks    Status  New            Plan - 07/25/18 1704    Clinical Impression Statement  Reviewed goals, FOTO complete with 60% limitation, and assured compliance with HEP.  Pt unable to recall HEP exercises, able to complete appropraite mechanics with all exekrcises following initial cueing for form and technique.  Review compliance next session.  Session focus on hip and knee strengthening exercises and knee mobiility with additional exercises added this session.  Pt making great gains iwht AROM at 4-125 degrees was 12-120 degrees last session.  EOS with manual retrograde massage for edema and pain control.  No reports of increased pain at EOS.  Pt given RTB to add to clam exercise at home.    Personal Factors and Comorbidities  Age;Comorbidity 1;Time since onset of injury/illness/exacerbation    Comorbidities  L MCL tear 10+ years ago, no internvention and no difficulties    Examination-Activity Limitations  Bend;Carry;Dressing;Squat;Stand    Examination-Participation Restrictions  Other;Cleaning   work   Stability/Clinical Decision Making  Stable/Uncomplicated    Clinical Decision Making  Low    Rehab Potential  Good    PT Frequency  2x / week    PT Duration  6 weeks    PT Treatment/Interventions  ADLs/Self Care Home Management;Aquatic Therapy;Biofeedback;Cryotherapy;Electrical Stimulation;Moist Heat;Ultrasound;Gait training;Stair training;Functional mobility training;Therapeutic activities;Therapeutic exercise;Balance training;Neuromuscular re-education;Patient/family education;Orthotic Fit/Training;Manual  techniques;Passive range of motion;Dry needling;Taping;Joint Manipulations    PT Next Visit Plan  Next session assure compliance wiht HEP.  Add standing TKE and progress to standing exercises for strengthening throughout BLE hips, L quad/hamstring, standing balance activities. Manual STM and joint mobs as needed for pain relief.    PT Home Exercise Plan  Eval: quad set, sidelying hip abd, sidelying clams 7/16: added RTB to clam       Patient will benefit from skilled therapeutic intervention in order to improve the following deficits and impairments:  Abnormal gait, Decreased balance, Decreased range of motion, Decreased strength, Difficulty walking, Hypomobility, Impaired flexibility, Pain  Visit Diagnosis: 1. Muscle weakness (generalized)   2. Other symptoms and signs involving the musculoskeletal system   3. Acute pain of left knee        Problem List Patient Active Problem List   Diagnosis Date Noted  . Acid reflux 02/24/2015  . Controlled type 2 diabetes mellitus without complication ([place]) 76/81/1572  . Anxiety attack 12/17/2013  . Gonalgia 09/30/2013  . Allergic rhinitis 08/30/2012  . Headache, migraine 08/30/2012  . Menopausal and perimenopausal disorder 08/30/2012  . Cannot sleep 03/12/2012  . BP (high blood pressure) 02/12/2012  . Clinical depression 01/12/2012   Ihor Austin, [place]; [place]  Aldona Lento 07/25/2018, 6:03 PM  [facility] [address] [place], [place], [address] Phone: [phone]   Fax:  [phone]  Name: Ashley Dickson MRN: [id number] Date of Birth: 11/03/1960

## 2018-07-30 DIAGNOSIS — G4700 Insomnia, unspecified: Secondary | ICD-10-CM | POA: Diagnosis not present

## 2018-07-30 DIAGNOSIS — F331 Major depressive disorder, recurrent, moderate: Secondary | ICD-10-CM | POA: Diagnosis not present

## 2018-07-30 DIAGNOSIS — E782 Mixed hyperlipidemia: Secondary | ICD-10-CM | POA: Diagnosis not present

## 2018-07-30 DIAGNOSIS — E1165 Type 2 diabetes mellitus with hyperglycemia: Secondary | ICD-10-CM | POA: Diagnosis not present

## 2018-07-30 DIAGNOSIS — I10 Essential (primary) hypertension: Secondary | ICD-10-CM | POA: Diagnosis not present

## 2018-07-31 ENCOUNTER — Other Ambulatory Visit: Payer: Self-pay

## 2018-07-31 ENCOUNTER — Encounter (HOSPITAL_COMMUNITY): Payer: Self-pay

## 2018-07-31 ENCOUNTER — Ambulatory Visit (HOSPITAL_COMMUNITY): Payer: BC Managed Care – PPO

## 2018-07-31 DIAGNOSIS — R29898 Other symptoms and signs involving the musculoskeletal system: Secondary | ICD-10-CM | POA: Diagnosis not present

## 2018-07-31 DIAGNOSIS — M25562 Pain in left knee: Secondary | ICD-10-CM

## 2018-07-31 DIAGNOSIS — M6281 Muscle weakness (generalized): Secondary | ICD-10-CM | POA: Diagnosis not present

## 2018-07-31 NOTE — Therapy (Signed)
Ashley Dicks[place], [place], [address] Phone: [phone]   Fax:  [phone]  Physical Therapy Treatment  Patient Details  Name: Ashley Dickson MRN: [id number] Date of Birth: 11-Jun-1960 Referring Provider (PT): Jillene Bucks, MD   Encounter Date: 07/31/2018  PT End of Session - 07/31/18 1629    Visit Number  3    Number of Visits  12    Date for PT Re-Evaluation  08/28/18   Minireassess 08/07/18   Authorization Type  [employer] P[employer]    Authorization Time Period  07/17/18 to 08/28/18    PT Start Time  1622    PT Stop Time  1703    PT Time Calculation (min)  41 min    Activity Tolerance  Patient tolerated treatment well    Behavior During Therapy  [facility] for tasks assessed/performed       Past Medical History:  Diagnosis Date  . Diabetes mellitus without complication ([place])   . Migraines   . Renal disorder   . Shingles     Past Surgical History:  Procedure Laterality Date  . CHOLECYSTECTOMY    . TUBAL LIGATION      There were no vitals filed for this visit.  Subjective Assessment - 07/31/18 1627    Subjective  Patient reports she is doing well today. She went shopping with her daughter this morning so she walked around a lot more than typical. She reports her knee isn't bothering her a lot today.    Limitations  Lifting;Standing;Walking;House hold activities;Other (comment)    Currently in Pain?  Yes    Pain Score  5     Pain Location  Knee    Pain Orientation  Left    Pain Descriptors / Indicators  Burning;Sharp    Pain Type  Acute pain   subacute   Pain Onset  1 to 4 weeks ago    Pain Frequency  Constant        [facility] Adult PT Treatment/Exercise - 07/31/18 0001      Exercises   Exercises  Knee/Hip      Knee/Hip Exercises: Supine   Short Arc Quad Sets  Left;2 sets;10 reps    Bridges  Both;2 sets;10 reps    Bridges Limitations  3" holds    Straight Leg Raises  Both;1 set;10 reps    Straight Leg  Raises Limitations  cue to deter hip flexor use    Knee Extension  AROM    Knee Extension Limitations  0    Knee Flexion  AROM    Knee Flexion Limitations  128      Knee/Hip Exercises: Sidelying   Hip ABduction  Both;2 sets;10 reps      Manual Therapy   Manual Therapy  Joint mobilization;Soft tissue mobilization    Manual therapy comments  Manual complete separate of rest of tx    Joint Mobilization  AP glide to Lt tibiofemoral joint, grade II for pain relief    Soft tissue mobilization  massage to distal quadriceps for pain relief       PT Education - 07/31/18 1708    Education Details  Educated on exercises throughout and interventions performed.    Person(s) Educated  Patient    Methods  Explanation    Comprehension  Verbalized understanding;Returned demonstration       PT Short Term Goals - 07/17/18 1148      PT SHORT TERM GOAL #1   Title  Pt will report 5/10 pain at worst and 3-4 seated rest breaks during 12 hour shift to return to improve abilities to complete work related tasks.    Time  3    Period  Weeks    Status  New    Target Date  08/07/18      PT SHORT TERM GOAL #2   Title  Pt will be independent with HEP to maximize functional mobility and maintain gains made with skilled PT interventions.    Time  3    Period  Weeks    Status  New      PT SHORT TERM GOAL #3   Title  Pt will improve FGA score to 22/30 or > to reduce risk for falls with gait activities at home and in the workplace.    Time  3    Period  Weeks    Status  New        PT Long Term Goals - 07/17/18 1150      PT LONG TERM GOAL #1   Title  Pt will report 2/10 pain at worst and 1-2 seated rest breaks during 12 hour shift to return to improve abilities to complete work related tasks.    Time  6    Period  Weeks    Status  New    Target Date  08/28/18      PT LONG TERM GOAL #2   Title  Pt with improve BLE strength by 1 grade to demo improved gait mechanics and reduced pain with  functional activity.    Time  6    Period  Weeks    Status  New      PT LONG TERM GOAL #3   Title  Pt will improve bil SLS score to 30 sec or > to reduce risk for falls.    Time  6    Period  Weeks    Status  New      PT LONG TERM GOAL #4   Title  Pt will perform L knee AROM to 0-120, without pain to demo increased strength, reduced pain, and improved gait mechanics on level ground and with stairs.    Time  6    Period  Weeks    Status  New         Plan - 07/31/18 1629    Clinical Impression Statement  Therapy session continued with focus on Lt knee mobility and strengthening. Patient initiated bike for AROM and denied pain throughout. She was found to have improved AROM of LT knee form 0-128 degrees in supine. Exercises continued with quad activation and gluteal activation, pt reported some discomfort along VMO of Lt quad and at EOS soft tissue mobilization and joint mobilization performed to reduce pain. Patient reported some decrease in soreness. She will continue to benefit from skilled PT interventions to further progress mobility and reduce pain.    Personal Factors and Comorbidities  Age;Comorbidity 1;Time since onset of injury/illness/exacerbation    Comorbidities  L MCL tear 10+ years ago, no internvention and no difficulties    Examination-Activity Limitations  Bend;Carry;Dressing;Squat;Stand    Examination-Participation Restrictions  Other;Cleaning   work   Stability/Clinical Decision Making  Stable/Uncomplicated    Rehab Potential  Good    PT Frequency  2x / week    PT Duration  6 weeks    PT Treatment/Interventions  ADLs/Self Care Home Management;Aquatic Therapy;Biofeedback;Cryotherapy;Electrical Stimulation;Moist Heat;Ultrasound;Gait training;Stair training;Functional mobility training;Therapeutic activities;Therapeutic exercise;Balance training;Neuromuscular re-education;Patient/family education;Orthotic Fit/Training;Manual  techniques;Passive range of motion;Dry  needling;Taping;Joint Manipulations    PT Next Visit Plan  FU on compliance wiht HEP.  Add standing TKE and progress to standing exercises for strengthening throughout BLE hips, L quad/hamstring, standing balance activities. Manual STM and joint mobs as needed for pain relief.    PT Home Exercise Plan  Eval: quad set, sidelying hip abd, sidelying clams 7/16: added RTB to clam    Consulted and Agree with Plan of Care  Patient       Patient will benefit from skilled therapeutic intervention in order to improve the following deficits and impairments:  Abnormal gait, Decreased balance, Decreased range of motion, Decreased strength, Difficulty walking, Hypomobility, Impaired flexibility, Pain  Visit Diagnosis: 1. Muscle weakness (generalized)   2. Other symptoms and signs involving the musculoskeletal system   3. Acute pain of left knee        Problem List Patient Active Problem List   Diagnosis Date Noted  . Acid reflux 02/24/2015  . Controlled type 2 diabetes mellitus without complication (HCC) 01/21/2015  . Anxiety attack 12/17/2013  . Gonalgia 09/30/2013  . Allergic rhinitis 08/30/2012  . Headache, migraine 08/30/2012  . Menopausal and perimenopausal disorder 08/30/2012  . Cannot sleep 03/12/2012  . BP (high blood pressure) 02/12/2012  . Clinical depression 01/12/2012    Valentino Saxonachel Quinn-Brown, PT, DPT, [facility] Physical Therapist with [facility]  07/31/2018 5:09 PM    [facility] [address] [place], [place], [address] Phone: [phone]   Fax:  [phone]  Name: Ashley BarrSheryl Dickson MRN: [id number] Date of Birth: 01/10/1960

## 2018-08-07 ENCOUNTER — Ambulatory Visit (HOSPITAL_COMMUNITY): Payer: BC Managed Care – PPO

## 2018-08-08 ENCOUNTER — Other Ambulatory Visit: Payer: Self-pay

## 2018-08-08 ENCOUNTER — Ambulatory Visit (HOSPITAL_COMMUNITY): Payer: BC Managed Care – PPO | Admitting: Physical Therapy

## 2018-08-08 ENCOUNTER — Encounter (HOSPITAL_COMMUNITY): Payer: Self-pay | Admitting: Physical Therapy

## 2018-08-08 DIAGNOSIS — M25562 Pain in left knee: Secondary | ICD-10-CM

## 2018-08-08 DIAGNOSIS — E1165 Type 2 diabetes mellitus with hyperglycemia: Secondary | ICD-10-CM | POA: Diagnosis not present

## 2018-08-08 DIAGNOSIS — M6281 Muscle weakness (generalized): Secondary | ICD-10-CM

## 2018-08-08 DIAGNOSIS — I10 Essential (primary) hypertension: Secondary | ICD-10-CM | POA: Diagnosis not present

## 2018-08-08 DIAGNOSIS — E782 Mixed hyperlipidemia: Secondary | ICD-10-CM | POA: Diagnosis not present

## 2018-08-08 DIAGNOSIS — R29898 Other symptoms and signs involving the musculoskeletal system: Secondary | ICD-10-CM | POA: Diagnosis not present

## 2018-08-08 NOTE — Therapy (Signed)
[facility] [address] [place], [place], [address] Phone: [phone]   Fax:  [phone]  Physical Therapy Treatment  Patient Details  Name: Ashley Dickson MRN: [id number] Date of Birth: 1960/02/13 Referring Provider (PT): Jillene Bucks, MD   Encounter Date: 08/08/2018   Progress Note Reporting Period 07/17/18 to 08/08/18  See note below for Objective Data and Assessment of Progress/Goals.       PT End of Session - 08/08/18 1609    Visit Number  4    Number of Visits  12 (progress note done visit 4)   Date for PT Re-Evaluation  08/28/18    Authorization Type  [employer] PPO    Authorization Time Period  07/17/18 to 08/28/18    PT Start Time  1517    PT Stop Time  1557    PT Time Calculation (min)  40 min    Activity Tolerance  Patient tolerated treatment well    Behavior During Therapy  [facility] for tasks assessed/performed       Past Medical History:  Diagnosis Date  . Diabetes mellitus without complication ([place])   . Migraines   . Renal disorder   . Shingles     Past Surgical History:  Procedure Laterality Date  . CHOLECYSTECTOMY    . TUBAL LIGATION      There were no vitals filed for this visit.  Subjective Assessment - 08/08/18 1516    Subjective  My knee is more sore than normal today, I don't know what I did differently than normal. outside of today it is starting to feel better.    Limitations  Lifting;Standing;Walking;House hold activities;Other (comment)    Currently in Pain?  Yes    Pain Score  3     Pain Location  Knee    Pain Orientation  Left    Pain Descriptors / Indicators  Sore    Pain Type  Acute pain    Pain Radiating Towards  none    Pain Onset  1 to 4 weeks ago    Pain Frequency  Constant         [facility] PT Assessment - 08/08/18 0001      AROM   Left Knee Extension  5    Left Knee Flexion  126      Strength   Right Hip Extension  4/5    Right Hip ABduction  4/5    Left Hip Flexion  4/5     Left Hip ABduction  4+/5    Right Knee Flexion  5/5    Right Knee Extension  5/5    Left Knee Flexion  5/5    Left Knee Extension  5/5      Functional Gait  Assessment   Gait assessed   Yes    Gait Level Surface  Walks 20 ft in less than 5.5 sec, no assistive devices, good speed, no evidence for imbalance, normal gait pattern, deviates no more than 6 in outside of the 12 in walkway width.    Change in Gait Speed  Able to change speed, demonstrates mild gait deviations, deviates 6-10 in outside of the 12 in walkway width, or no gait deviations, unable to achieve a major change in velocity, or uses a change in velocity, or uses an assistive device.    Gait with Horizontal Head Turns  Performs head turns smoothly with no change in gait. Deviates no more than 6 in outside 12 in walkway width  Gait with Vertical Head Turns  Performs head turns with no change in gait. Deviates no more than 6 in outside 12 in walkway width.    Gait and Pivot Turn  Pivot turns safely within 3 sec and stops quickly with no loss of balance.    Step Over Obstacle  Is able to step over 2 stacked shoe boxes taped together (9 in total height) without changing gait speed. No evidence of imbalance.    Gait with Narrow Base of Support  Is able to ambulate for 10 steps heel to toe with no staggering.    Gait with Eyes Closed  Walks 20 ft, uses assistive device, slower speed, mild gait deviations, deviates 6-10 in outside 12 in walkway width. Ambulates 20 ft in less than 9 sec but greater than 7 sec.    Ambulating Backwards  Walks 20 ft, uses assistive device, slower speed, mild gait deviations, deviates 6-10 in outside 12 in walkway width.    Steps  Alternating feet, must use rail.    Total Score  26                   [facility] Adult PT Treatment/Exercise - 08/08/18 0001      Knee/Hip Exercises: Standing   Heel Raises  Both;1 set;10 reps    Heel Raises Limitations  heel and toe     Lateral Step Up  Both;1  set;10 reps;Step Height: 2"    Lateral Step Up Limitations  2 inch step     Forward Step Up  Both;1 set;10 reps    Forward Step Up Limitations  2 inch step     Step Down  Both;1 set;10 reps;Step Height: 2"    Rocker Board  2 minutes    Other Standing Knee Exercises  eccentric stand to sit from mat table and 4-inch box for additional height 1x10     Other Standing Knee Exercises  hip hikes 2x10 with and without swing       Knee/Hip Exercises: Supine   Short Arc Quad Sets  Left;1 set;15 reps    Bridges  Both;1 set;15 reps    Straight Leg Raises  Both;1 set;15 reps    Straight Leg Raises Limitations  cues for quad set              PT Education - 08/08/18 1609    Education Details  exercise form    Person(s) Educated  Patient    Methods  Explanation    Comprehension  Verbalized understanding       PT Short Term Goals - 08/08/18 1524      PT SHORT TERM GOAL #1   Title  Pt will report 5/10 pain at worst and 3-4 seated rest breaks during 12 hour shift to return to improve abilities to complete work related tasks.    Baseline  7/30- doing well with 3-4 breaks, pain is variable    Time  3    Period  Weeks    Status  Partially Met    Target Date  08/07/18      PT SHORT TERM GOAL #2   Title  Pt will be independent with HEP to maximize functional mobility and maintain gains made with skilled PT interventions.    Baseline  7/30- compliant and no questions/concerns    Time  3    Period  Weeks    Status  Achieved      PT SHORT TERM GOAL #3   Title  Pt will improve FGA score to 22/30 or > to reduce risk for falls with gait activities at home and in the workplace.    Baseline  7/30- 26    Time  3    Period  Weeks    Status  Achieved        PT Long Term Goals - 08/08/18 1525      PT LONG TERM GOAL #1   Title  Pt will report 2/10 pain at worst and 1-2 seated rest breaks during 12 hour shift to return to improve abilities to complete work related tasks.    Time  6    Period   Weeks    Status  On-going      PT LONG TERM GOAL #2   Title  Pt with improve BLE strength by 1 grade to demo improved gait mechanics and reduced pain with functional activity.    Time  6    Period  Weeks    Status  On-going      PT LONG TERM GOAL #3   Title  Pt will improve bil SLS score to 30 sec or > to reduce risk for falls.    Baseline  7/30- dnt due to knee pain today    Time  6    Period  Weeks    Status  On-going      PT LONG TERM GOAL #4   Title  Pt will perform L knee AROM to 0-120, without pain to demo increased strength, reduced pain, and improved gait mechanics on level ground and with stairs.    Baseline  7/30- see flowsheets    Time  6    Period  Weeks    Status  Partially Met            Plan - 08/08/18 1610    Clinical Impression Statement  Mini-reassess performed today. Patient is making good progress with skilled PT services and shows improvement in knee ROM and strength, as well as gait mechanics and balance. She does continue to demonstrate considerable difficulty in navigating steps, likely due to lack of eccentric strength and motor control. Currently recommending continuation of skilled therapy services to continue to improve function and address goals yet unmet.    Personal Factors and Comorbidities  Age;Comorbidity 1;Time since onset of injury/illness/exacerbation    Comorbidities  L MCL tear 10+ years ago, no internvention and no difficulties    Examination-Activity Limitations  Bend;Carry;Dressing;Squat;Stand    Examination-Participation Restrictions  Other;Cleaning    Stability/Clinical Decision Making  Stable/Uncomplicated    Clinical Decision Making  Low    Rehab Potential  Good    PT Frequency  2x / week    PT Duration  6 weeks    PT Treatment/Interventions  ADLs/Self Care Home Management;Aquatic Therapy;Biofeedback;Cryotherapy;Electrical Stimulation;Moist Heat;Ultrasound;Gait training;Stair training;Functional mobility training;Therapeutic  activities;Therapeutic exercise;Balance training;Neuromuscular re-education;Patient/family education;Orthotic Fit/Training;Manual techniques;Passive range of motion;Dry needling;Taping;Joint Manipulations    PT Next Visit Plan  FU on compliance wiht HEP.  Add standing TKE and progress to standing exercises for strengthening throughout BLE hips, L quad/hamstring, standing balance activities. Manual STM and joint mobs as needed for pain relief.    PT Home Exercise Plan  Eval: quad set, sidelying hip abd, sidelying clams 7/16: added RTB to clam    Consulted and Agree with Plan of Care  Patient       Patient will benefit from skilled therapeutic intervention in order to improve the following deficits and impairments:  Abnormal gait, Decreased  balance, Decreased range of motion, Decreased strength, Difficulty walking, Hypomobility, Impaired flexibility, Pain  Visit Diagnosis: 1. Muscle weakness (generalized)   2. Other symptoms and signs involving the musculoskeletal system   3. Acute pain of left knee        Problem List Patient Active Problem List   Diagnosis Date Noted  . Acid reflux 02/24/2015  . Controlled type 2 diabetes mellitus without complication ([place]) 56/21/3086  . Anxiety attack 12/17/2013  . Gonalgia 09/30/2013  . Allergic rhinitis 08/30/2012  . Headache, migraine 08/30/2012  . Menopausal and perimenopausal disorder 08/30/2012  . Cannot sleep 03/12/2012  . BP (high blood pressure) 02/12/2012  . Clinical depression 01/12/2012    Deniece Ree PT, DPT, CBIS  Supplemental Physical Therapist [facility]    Pager [phone] Acute Rehab Office [place] [address] [place], [place], [address] Phone: [phone]   Fax:  [phone]  Name: Ashley Dickson MRN: [id number] Date of Birth: [birth year]

## 2018-08-09 ENCOUNTER — Ambulatory Visit (HOSPITAL_COMMUNITY): Payer: BC Managed Care – PPO

## 2018-08-14 ENCOUNTER — Ambulatory Visit (HOSPITAL_COMMUNITY): Payer: BC Managed Care – PPO | Admitting: Physical Therapy

## 2018-08-14 ENCOUNTER — Telehealth (HOSPITAL_COMMUNITY): Payer: Self-pay | Admitting: Internal Medicine

## 2018-08-14 NOTE — Telephone Encounter (Signed)
08/14/18  pt called to cancel said that she had been called in to work

## 2018-08-15 ENCOUNTER — Ambulatory Visit (HOSPITAL_COMMUNITY): Payer: BC Managed Care – PPO

## 2018-08-19 ENCOUNTER — Telehealth (HOSPITAL_COMMUNITY): Payer: Self-pay | Admitting: Physical Therapy

## 2018-08-19 ENCOUNTER — Ambulatory Visit (HOSPITAL_COMMUNITY): Payer: BC Managed Care – PPO | Attending: Orthopedic Surgery | Admitting: Physical Therapy

## 2018-08-19 DIAGNOSIS — M25562 Pain in left knee: Secondary | ICD-10-CM | POA: Insufficient documentation

## 2018-08-19 DIAGNOSIS — M6281 Muscle weakness (generalized): Secondary | ICD-10-CM | POA: Insufficient documentation

## 2018-08-19 DIAGNOSIS — R29898 Other symptoms and signs involving the musculoskeletal system: Secondary | ICD-10-CM | POA: Insufficient documentation

## 2018-08-19 NOTE — Telephone Encounter (Signed)
No-show for appointment. Attempted to call patient however phone rang twice and then went through to a message saying "This is [employer], can you please tell me what you're calling about?". Did not leave message as unsure if number was correct/if message would reach patient. Unable to contact patient this morning.  Deniece Ree PT, DPT, CBIS  Supplemental Physical Therapist [facility]    Pager [phone] Acute Rehab Office [phone]

## 2018-08-21 ENCOUNTER — Ambulatory Visit (HOSPITAL_COMMUNITY): Payer: BC Managed Care – PPO

## 2018-08-21 ENCOUNTER — Telehealth (HOSPITAL_COMMUNITY): Payer: Self-pay | Admitting: Internal Medicine

## 2018-08-21 NOTE — Telephone Encounter (Signed)
08/21/18  Pt left a message to [facility] because she was sick

## 2018-08-27 ENCOUNTER — Telehealth (HOSPITAL_COMMUNITY): Payer: Self-pay

## 2018-08-27 ENCOUNTER — Ambulatory Visit (HOSPITAL_COMMUNITY): Payer: BC Managed Care – PPO

## 2018-08-27 NOTE — Telephone Encounter (Signed)
She went to have her car worked on this morning and they are still working on it- She can not get her by 11:15 today. She wanted to come in later or come in tomorrow before 1pm if possible - put on waitlist

## 2018-08-28 ENCOUNTER — Ambulatory Visit (HOSPITAL_COMMUNITY): Payer: BC Managed Care – PPO | Admitting: Physical Therapy

## 2018-08-28 ENCOUNTER — Encounter

## 2018-08-29 ENCOUNTER — Encounter (HOSPITAL_COMMUNITY): Payer: Self-pay | Admitting: Physical Therapy

## 2018-08-29 ENCOUNTER — Other Ambulatory Visit: Payer: Self-pay

## 2018-08-29 ENCOUNTER — Ambulatory Visit (HOSPITAL_COMMUNITY): Payer: BC Managed Care – PPO | Admitting: Physical Therapy

## 2018-08-29 DIAGNOSIS — M6281 Muscle weakness (generalized): Secondary | ICD-10-CM | POA: Diagnosis not present

## 2018-08-29 DIAGNOSIS — M25562 Pain in left knee: Secondary | ICD-10-CM | POA: Diagnosis not present

## 2018-08-29 DIAGNOSIS — R29898 Other symptoms and signs involving the musculoskeletal system: Secondary | ICD-10-CM

## 2018-08-29 NOTE — Therapy (Signed)
[place], [place], [address] Phone: [phone]   Fax:  [phone]  Physical Therapy Treatment  Patient Details  Name: Ashley Dickson MRN: [id number] Date of Birth: Feb 09, 1960 Referring Provider (PT): Ashley Bucks, MD   Encounter Date: 08/29/2018  PT End of Session - 08/29/18 1653    Visit Number  5    Number of Visits  12    Authorization Type  [employer] PPO    Authorization Time Period  07/17/18 to 08/28/18; NEW 08/29/18-09/29/18 (in case she is able to continue with PT)    PT Start Time  1615    PT Stop Time  1638    PT Time Calculation (min)  23 min    Activity Tolerance  Patient tolerated treatment well    Behavior During Therapy  [facility] for tasks assessed/performed       Past Medical History:  Diagnosis Date  . Diabetes mellitus without complication ([place])   . Migraines   . Renal disorder   . Shingles     Past Surgical History:  Procedure Laterality Date  . CHOLECYSTECTOMY    . TUBAL LIGATION      There were no vitals filed for this visit.  Subjective Assessment - 08/29/18 1620    Subjective  My knee has been hurting especially around the knee cap; it starts medial knee and goes all the way to the back of my leg. I don't remember doing anything different htat caused this. I've not been able to come due to work  but I have been keeping up with my HEP. I've not done anything the past couple days because its been hurting so bad.    How long can you sit comfortably?  8/20- unlimited but getting up is hard    How long can you stand comfortably?  8/20- 20 minutes    How long can you walk comfortably?  8/20- can't walk much without buggy, 15 minutes would be max    Diagnostic tests  x-ray (negative)    Patient Stated Goals  to not hurt, not have surgery    Currently in Pain?  Yes    Pain Score  8     Pain Location  Knee    Pain Orientation  Left    Pain Descriptors / Indicators  Sharp;Other (Comment)    twisting   Pain Type  Acute pain    Pain Radiating Towards  across knee, also going down to anlke    Pain Onset  In the past 7 days    Pain Frequency  Constant    Aggravating Factors   everything    Pain Relieving Factors  laying on side and bending knee all the way    Effect of Pain on Daily Activities  severe         [facility] PT Assessment - 08/29/18 0001      Assessment   Medical Diagnosis  acute pain of L knee    Referring Provider (PT)  Ashley Bucks, MD    Onset Date/Surgical Date  06/29/18    Next MD Visit  PRN     Prior Therapy  no       Precautions   Precautions  None      Restrictions   Weight Bearing Restrictions  No      Balance Screen   Has the patient fallen in the past 6 months  No    Has the patient had  a decrease in activity level because of a fear of falling?   No    Is the patient reluctant to leave their home because of a fear of falling?   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), on feet, occasional weighted carry    Leisure  Sleep, play with grandsons      AROM   Left Knee Extension  15    Left Knee Flexion  35      Strength   Right Hip Flexion  3/5    Right Hip Extension  3/5    Right Hip ABduction  4/5    Left Hip Flexion  3/5    Left Hip Extension  3/5    Left Hip ABduction  4/5    Right Knee Flexion  4/5    Right Knee Extension  4+/5    Left Knee Flexion  4+/5    Left Knee Extension  4/5   in available ROM    Right Ankle Dorsiflexion  5/5    Left Ankle Dorsiflexion  5/5      Palpation   Patella mobility  limited mobility, painful     Palpation comment  tenderness posterior joint line, distal hamstrings       Special Tests   Other special tests  thessaly test positive L       McMurray Test   Comments  unable to attempt due to pain                            PT Education -  08/29/18 1653    Education Details  return to MD; PT on hold pending MD clearance given new onset of pain with concerning presentation such as limited ROM and catching    Person(s) Educated  Patient    Methods  Explanation    Comprehension  Verbalized understanding       PT Short Term Goals - 08/08/18 1524      PT SHORT TERM GOAL #1   Title  Pt will report 5/10 pain at worst and 3-4 seated rest breaks during 12 hour shift to return to improve abilities to complete work related tasks.    Baseline  7/30- doing well with 3-4 breaks, pain is variable    Time  3    Period  Weeks    Status  Partially Met    Target Date  08/07/18      PT SHORT TERM GOAL #2   Title  Pt will be independent with HEP to maximize functional mobility and maintain gains made with skilled PT interventions.    Baseline  7/30- compliant and no questions/concerns    Time  3    Period  Weeks    Status  Achieved      PT SHORT TERM GOAL #3   Title  Pt will improve FGA score to 22/30 or > to reduce risk for falls with gait activities at home and in the workplace.    Baseline  7/30- 26    Time  3    Period  Weeks    Status  Achieved        PT Long Term Goals - 08/08/18 1525      PT LONG TERM GOAL #1   Title  Pt will report 2/10 pain at worst and 1-2 seated rest breaks during 12 hour shift to return to improve abilities to complete work related tasks.    Time  6    Period  Weeks    Status  On-going      PT LONG TERM GOAL #2   Title  Pt with improve BLE strength by 1 grade to demo improved gait mechanics and reduced pain with functional activity.    Time  6    Period  Weeks    Status  On-going      PT LONG TERM GOAL #3   Title  Pt will improve bil SLS score to 30 sec or > to reduce risk for falls.    Baseline  7/30- dnt due to knee pain today    Time  6    Period  Weeks    Status  On-going      PT LONG TERM GOAL #4   Title  Pt will perform L knee AROM to 0-120, without pain to demo increased strength,  reduced pain, and improved gait mechanics on level ground and with stairs.    Baseline  7/30- see flowsheets    Time  6    Period  Weeks    Status  Partially Met            Plan - 08/29/18 1654    Clinical Impression Statement  Ashley Dickson arrives today reporting a new issue with her knee that just started over the past few days; she reports no initiating factors for her pain and is unsure what happened to cause this as she had been doing well. She has had difficulty coming to PT sessions recently due to work schedule. Examination reveals severe ROM limitations, MMT WNL within tolerable ROM but otherwise pain limited, general laxity of knee ligaments, gait impairment, positive Thessaly test. Unable to further perform meniscus testing due to pain and difficulty getting into position. Very tender to palpation posterior knee along joint line and distal hamstring. Due to onset of insidious pain suspicious for acute injury, recommend return to MD for clearance to continue PT; therapy on hold pending MD appointment.    Personal Factors and Comorbidities  Age;Comorbidity 1;Time since onset of injury/illness/exacerbation    Comorbidities  L MCL tear 10+ years ago, no internvention and no difficulties    Examination-Activity Limitations  Bend;Carry;Dressing;Squat;Stand    Examination-Participation Restrictions  Other;Cleaning    Stability/Clinical Decision Making  Stable/Uncomplicated    Clinical Decision Making  Low    Rehab Potential  Good    PT Frequency  --   pending MD clearance   PT Duration  --   pending MD clearance   PT Treatment/Interventions  ADLs/Self Care Home Management;Aquatic Therapy;Biofeedback;Cryotherapy;Electrical Stimulation;Moist Heat;Ultrasound;Gait training;Stair training;Functional mobility training;Therapeutic activities;Therapeutic exercise;Balance training;Neuromuscular re-education;Patient/family education;Orthotic Fit/Training;Manual techniques;Passive range of  motion;Dry needling;Taping;Joint Manipulations    PT Next Visit Plan  needs MD clearance to continue given new onset of pain, ROM loss, catching, knee buckling    PT Home Exercise Plan  Eval: quad set, sidelying hip abd, sidelying clams 7/16: added RTB to clam    Consulted and Agree with Plan of Care  Patient       Patient will benefit from skilled therapeutic intervention in order to improve the following deficits and impairments:  Abnormal gait, Decreased balance, Decreased range of motion, Decreased strength, Difficulty walking, Hypomobility, Impaired flexibility, Pain  Visit Diagnosis: Muscle weakness (generalized) - Plan: PT plan of care  cert/re-cert  Other symptoms and signs involving the musculoskeletal system - Plan: PT plan of care cert/re-cert  Acute pain of left knee - Plan: PT plan of care cert/re-cert     Problem List Patient Active Problem List   Diagnosis Date Noted  . Acid reflux 02/24/2015  . Controlled type 2 diabetes mellitus without complication ([place]) 96/28/3662  . Anxiety attack 12/17/2013  . Gonalgia 09/30/2013  . Allergic rhinitis 08/30/2012  . Headache, migraine 08/30/2012  . Menopausal and perimenopausal disorder 08/30/2012  . Cannot sleep 03/12/2012  . BP (high blood pressure) 02/12/2012  . Clinical depression 01/12/2012    Deniece Ree PT, DPT, CBIS  Supplemental Physical Therapist [facility]    Pager [phone] Acute Rehab Office [place] [address] [place], [place], [address] Phone: [phone]   Fax:  [phone]  Name: Ashley Dickson MRN: [id number] Date of Birth: January 26, 1960

## 2018-09-04 ENCOUNTER — Other Ambulatory Visit: Payer: Self-pay

## 2018-09-04 ENCOUNTER — Ambulatory Visit (INDEPENDENT_AMBULATORY_CARE_PROVIDER_SITE_OTHER): Payer: BC Managed Care – PPO | Admitting: Orthopedic Surgery

## 2018-09-04 VITALS — BP 107/70 | HR 87 | Temp 98.2°F | Ht 66.0 in | Wt 210.0 lb

## 2018-09-04 DIAGNOSIS — S83242D Other tear of medial meniscus, current injury, left knee, subsequent encounter: Secondary | ICD-10-CM

## 2018-09-04 DIAGNOSIS — G8929 Other chronic pain: Secondary | ICD-10-CM

## 2018-09-04 DIAGNOSIS — M25562 Pain in left knee: Secondary | ICD-10-CM | POA: Diagnosis not present

## 2018-09-04 NOTE — Progress Notes (Signed)
Progress Note   Patient ID: Ashley Dickson, female   DOB: [birth year], 58 y.o.   MRN: [id number]   Chief Complaint  Patient presents with  . Follow-up    Recheck on left knee.    Encounter Diagnoses  Name Primary?  . Chronic pain of left knee Yes  . 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  She had 6 weeks of physical therapy still has pain in her left knee.  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  The pain started in the left knee about 15 years ago when she had what she describes as a MCL injury which was treated with a brace at the [place] [facility].  She says she babied her knee for 15 years and did well   Last Saturday she felt a cramp in the back of the knee got up something popped she felt acute pain.  She treated herself with ice and CBD cream and by Sunday morning she was better however on Tuesday she twisted her left knee felt another pop on the medial side of the knee with acute onset of pain swelling decreased range of motion and she presents with a locked knee with inability to extend the knee.   She went to the ER last night x-ray was negative except for some mild arthritis of the medial compartment she was given a knee immobilizer and started on Voltaren with no improvement  No change from June 24 Review of Systems  Cardiovascular: Positive for leg swelling.  Gastrointestinal: Positive for heartburn.  Musculoskeletal: Positive for back pain and joint pain.  Endo/Heme/Allergies: Positive for environmental allergies.  Psychiatric/Behavioral: Positive for depression.  All other systems reviewed and are negative.     BP 107/70   Pulse 87   Temp 98.2 F  (36.8 C)   Ht 5\' 6"  (1.676 m)   Wt 210 lb (95.3 kg)   BMI 33.89 kg/m   Physical Exam Vitals signs and nursing note reviewed.  Constitutional:      Appearance: Normal appearance.  Musculoskeletal:     Comments: Left knee effusion Medial tenderness Decreased range of motion Normal strength No instability Positive McMurray  Neurological:     Mental Status: She is alert and oriented to person, place, and time.     Gait: Gait abnormal.  Psychiatric:        Mood and Affect: Mood normal.      Medical decisions:  (Established problem worse, x-ray ,physical therapy, over-the-counter medicines, read outside film or summarize x-ray)  Data  Imaging:   Initial plain film no acute fracture  Encounter Diagnoses  Name Primary?  . Chronic pain of left knee Yes  . Acute medial meniscus tear of left knee, subsequent encounter     PLAN:   MRI left knee  Make plans for surgery call patient with results and further plan    Arther Abbott, MD 09/04/2018 9:01 AM

## 2018-09-04 NOTE — Patient Instructions (Addendum)
You have been scheduled for an MRI scan We will call your insurance company to do a precertification to get the MRI covered You will receive a phone call regarding the date of the scan 

## 2018-09-09 ENCOUNTER — Telehealth: Payer: Self-pay | Admitting: Orthopedic Surgery

## 2018-09-09 ENCOUNTER — Telehealth: Payer: Self-pay | Admitting: Radiology

## 2018-09-09 NOTE — Telephone Encounter (Signed)
Patient left voice message regarding MRI-states she called her insurer Friday, 09/06/18 "and they had no information yet about an MRI" - please call to advise.

## 2018-09-09 NOTE — Telephone Encounter (Signed)
I am waiting for the insurance to approve, had had to file reconsideration on previous study request, I will call her when I am not in clinic.

## 2018-09-09 NOTE — Telephone Encounter (Signed)
Thanks, did they give you a different fax number? Or did the person I spoke to at [facility] give me the correct one?

## 2018-09-09 NOTE — Telephone Encounter (Signed)
I called her to give her the pending case number if she wants to call them again. She states she will.

## 2018-09-09 NOTE — Telephone Encounter (Signed)
[employer] representative did not provide any contact information for [employer]; states as noted that they would be handling re-consideration; therefore, to contact [employer] at numbers we have on file.

## 2018-09-09 NOTE — Telephone Encounter (Signed)
-----   Message from Uvaldo Bristle sent at 09/09/2018  2:06 PM EDT ----- Regarding: call from [employer] received from [place], ph# [phone], Authorization department, following up on status of MRI - asking if they are awaiting further clinicals. Per Arzella Rehmann, fax was sent 09/04/18, for re-consideration. [place] indicated that it would be [employer] handling the request for reconsideration. (verified case#)

## 2018-09-11 DIAGNOSIS — E1165 Type 2 diabetes mellitus with hyperglycemia: Secondary | ICD-10-CM | POA: Diagnosis not present

## 2018-09-11 DIAGNOSIS — R112 Nausea with vomiting, unspecified: Secondary | ICD-10-CM | POA: Diagnosis not present

## 2018-09-11 DIAGNOSIS — M1990 Unspecified osteoarthritis, unspecified site: Secondary | ICD-10-CM | POA: Diagnosis not present

## 2018-09-11 DIAGNOSIS — K219 Gastro-esophageal reflux disease without esophagitis: Secondary | ICD-10-CM | POA: Diagnosis not present

## 2018-09-11 DIAGNOSIS — G4700 Insomnia, unspecified: Secondary | ICD-10-CM | POA: Diagnosis not present

## 2018-09-11 DIAGNOSIS — F331 Major depressive disorder, recurrent, moderate: Secondary | ICD-10-CM | POA: Diagnosis not present

## 2018-09-12 ENCOUNTER — Encounter: Payer: Self-pay | Admitting: Internal Medicine

## 2018-09-25 ENCOUNTER — Ambulatory Visit (HOSPITAL_COMMUNITY)
Admission: RE | Admit: 2018-09-25 | Discharge: 2018-09-25 | Disposition: A | Discharge status: Home / Self Care | Payer: BC Managed Care – PPO | Source: Ambulatory Visit | Attending: Orthopedic Surgery | Admitting: Orthopedic Surgery

## 2018-09-25 ENCOUNTER — Other Ambulatory Visit: Payer: Self-pay

## 2018-09-25 DIAGNOSIS — S83232A Complex tear of medial meniscus, current injury, left knee, initial encounter: Secondary | ICD-10-CM | POA: Diagnosis not present

## 2018-09-25 DIAGNOSIS — M25562 Pain in left knee: Secondary | ICD-10-CM | POA: Diagnosis not present

## 2018-09-25 DIAGNOSIS — G8929 Other chronic pain: Secondary | ICD-10-CM | POA: Diagnosis not present

## 2018-09-25 DIAGNOSIS — S83282A Other tear of lateral meniscus, current injury, left knee, initial encounter: Secondary | ICD-10-CM | POA: Diagnosis not present

## 2018-10-02 ENCOUNTER — Telehealth: Payer: Self-pay | Admitting: Orthopedic Surgery

## 2018-10-02 NOTE — Telephone Encounter (Signed)
.    IMPRESSION: 1. Complex tear of the posterior medial meniscus extending to the root junction with extrusion of the mid body. 2. Nondisplaced tear of the anterior lateral meniscus 3. Intact cruciate ligaments 4. 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 Moderate knee joint effusion 6. Loculated popliteal cyst with evidence of recent partial rupture 7. Muscular edema within the medial head of the gastrocnemius.

## 2018-10-02 NOTE — Telephone Encounter (Signed)
Patient called for MRI results from 09/25/18 - said she has not received a call. Phone# [phone] / we are also sending her information to sign up for MyChart.

## 2018-10-05 ENCOUNTER — Telehealth: Payer: Self-pay | Admitting: Orthopedic Surgery

## 2018-10-05 NOTE — Telephone Encounter (Signed)
Discussed results with patient she has a torn medial and lateral meniscus with arthritis of the knee a popliteal cyst  She will discuss with Amy in the office when she wants to have the surgery

## 2018-10-05 NOTE — Telephone Encounter (Signed)
Left knee torn meniscus   Had to leave message

## 2018-10-14 ENCOUNTER — Other Ambulatory Visit: Payer: Self-pay | Admitting: Orthopedic Surgery

## 2018-10-14 ENCOUNTER — Telehealth: Payer: Self-pay

## 2018-10-14 NOTE — Telephone Encounter (Signed)
YES

## 2018-10-14 NOTE — Telephone Encounter (Signed)
      Dr Althia Forts note states he Discussed results with patient she has a torn medial and lateral meniscus with arthritis of the knee a popliteal cyst  She wants to proceed with surgery on 10/31/18  Ok to post her for a SALK lateral and medial meniscectomy?

## 2018-10-14 NOTE — Telephone Encounter (Signed)
Patient left message stating that she would like to speak with you in regards to scheduling her knee surgery.  Please call and advise [phone]

## 2018-10-15 NOTE — Progress Notes (Signed)
Referring Provider: Benita Stabile, MD Primary Care Physician:  Benita Stabile, MD Primary Gastroenterologist:  Dr. Jena Gauss  Chief Complaint  Patient presents with  . Gastroesophageal Reflux    nausea ll day  . h pylori    positive 3 weeks ago. Finished treatment    HPI:   Ashley Dickson is a 58 y.o. female presenting today at the request of Margo Aye, Kathleene Hazel, MD for GERD.   Today she states since her gallbladder removed there are certain foods she can't eat. Used to take Zantac occasionally. Can't eat beef. Reflux started worsening about 1 month ago. Had H. Pylori test that was positive. States she was started on clarithromycin, amoxicillin, and Protonix daily. Also started on carafate. Reports she was also started on Nexium and pepcid. Will take Nexium only if she needed it.   Continues with reflux daily. Having to take something 3 times a day and still will have symptoms. Taking Protonix daily, 30 minutes before breakfast, pepcid before bed, carafate, and Nexium as needed. Knows beef is a significant trigger. Symptoms are worse in the morning. Will have significant reflux of acid. Typically only meal she eats dinner. Eats around 5 or 6 and goes to bed around 10 or 11. No dysphagia. Intermittent nausea throughout the day. Sometimes not with the reflux. Will vomit at times. About twice a week. Was about ever day. No hematemesis. No blood in the stool or black stools.   Has been taking Mobic for years for her left knee. Having surgery on 10/31/18 for this. No other NSAIDs.   Reports long history of constipation.  Bowel movement every 3 to 4 days.  Stools are typically hard initially then will have 2 or 3 other softer bowel movements thereafter.  Often bowel movements require straining. Not currently taking anything to help with constipation. No prior colonoscopy.   No fever, chills. No lightheadedness, dizziness, or feeling like she will pass out. No chest pain, palpitations, shortness of breath or  cough. No cold or flu like symptoms.   History of intranasal cocaine use. Never screened for Hep C.   Past Medical History:  Diagnosis Date  . Diabetes mellitus without complication ([facility])   . H pylori ulcer   . Migraines   . Renal disorder   . Shingles     Past Surgical History:  Procedure Laterality Date  . CHOLECYSTECTOMY    . TUBAL LIGATION      Current Outpatient Medications  Medication Sig Dispense Refill  . atorvastatin (LIPITOR) 40 MG tablet Take 1 tablet by mouth daily.    Marland Kitchen buPROPion (WELLBUTRIN XL) 300 MG 24 hr tablet Take 300 mg by mouth daily.    . chlorthalidone (HYGROTON) 25 MG tablet Take 25 mg by mouth daily.    . Escitalopram Oxalate (LEXAPRO PO) Take 1 tablet by mouth daily.    Marland Kitchen glipiZIDE (GLUCOTROL XL) 5 MG 24 hr tablet Take 10 mg by mouth.     . irbesartan (AVAPRO) 75 MG tablet Take 75 mg by mouth daily.    . meloxicam (MOBIC) 7.5 MG tablet Take 15 mg by mouth daily.    . metFORMIN (GLUMETZA) 1000 MG (MOD) 24 hr tablet Take 1,000 mg by mouth 2 (two) times daily with a meal.    . Ondansetron HCl (ZOFRAN PO) Take by mouth as needed.    . Semaglutide,0.25 or 0.5MG /DOS, (OZEMPIC, 0.25 OR 0.5 MG/DOSE,) 2 MG/1.5ML SOPN Inject into the skin. Once a week. Taking on Thursday.    Marland Kitchen  sucralfate (CARAFATE) 1 g tablet 1 tablet 2 (two) times daily.    . pantoprazole (PROTONIX) 40 MG tablet Take 1 tablet (40 mg total) by mouth 2 (two) times daily before a meal. 60 tablet 5   No current facility-administered medications for this visit.     Allergies as of 10/16/2018 - Review Complete 10/16/2018  Allergen Reaction Noted  . Azithromycin Anaphylaxis 02/16/2015  . Tramadol Itching 02/16/2015  . Ace inhibitors Cough 02/24/2015  . Sumatriptan Other (See Comments) 02/24/2015  . Voltaren [diclofenac]  10/16/2018    Family History  Problem Relation Age of Onset  . Colon cancer Neg Hx   . Colon polyps Neg Hx     Social History   Socioeconomic History  . Marital  status: Single    Spouse name: Not on file  . Number of children: Not on file  . Years of education: Not on file  . Highest education level: Not on file  Occupational History  . Not on file  Social Needs  . Financial resource strain: Not on file  . Food insecurity    Worry: Not on file    Inability: Not on file  . Transportation needs    Medical: Not on file    Non-medical: Not on file  Tobacco Use  . Smoking status: Never Smoker  . Smokeless tobacco: Never Used  Substance and Sexual Activity  . Alcohol use: Yes    Comment: rarely  . Drug use: Not Currently    Types: Cocaine    Comment: Last used 20 years ago.   Marland Kitchen. Sexual activity: Not on file  Lifestyle  . Physical activity    Days per week: Not on file    Minutes per session: Not on file  . Stress: Not on file  Relationships  . Social Musicianconnections    Talks on phone: Not on file    Gets together: Not on file    Attends religious service: Not on file    Active member of club or organization: Not on file    Attends meetings of clubs or organizations: Not on file    Relationship status: Not on file  . Intimate partner violence    Fear of current or ex partner: Not on file    Emotionally abused: Not on file    Physically abused: Not on file    Forced sexual activity: Not on file  Other Topics Concern  . Not on file  Social History Narrative  . Not on file    Review of Systems: Gen: Denies any fever, chills, fatigue, weight loss, lack of appetite.  CV: Denies chest pain, heart palpitations, peripheral edema, syncope.  Resp: Denies shortness of breath at rest or with exertion. Denies wheezing or cough.  GI: See HPI GU : Denies urinary burning, urinary frequency, urinary hesitancy MS: See HPI Derm: Denies rash Psych: Denies depression, anxiety Heme: Denies bruising, bleeding  Physical Exam: BP 118/77   Pulse 85   Temp (!) 97 F (36.1 C) (Oral)   Ht 5\' 6"  (1.676 m)   Wt 206 lb 9.6 oz (93.7 kg)   BMI 33.35  kg/m  General:   Alert and oriented. Pleasant and cooperative. Well-nourished and well-developed.  Head:  Normocephalic and atraumatic. Eyes:  Without icterus, sclera clear and conjunctiva pink.  Ears:  Normal auditory acuity. Nose:  No deformity, discharge,  or lesions. Lungs:  Clear to auscultation bilaterally. No wheezes, rales, or rhonchi. No distress.  Heart:  S1, S2  present without murmurs appreciated.  Abdomen:  +BS, soft, non-tender and non-distended. No HSM noted. No guarding or rebound. No masses appreciated.  Rectal:  Deferred  Msk:  Symmetrical without gross deformities. Normal posture. Extremities:  Without edema. Neurologic:  Alert and  oriented x4;  grossly normal neurologically. Skin:  Intact without significant lesions or rashes. Psych:  Normal mood and affect.

## 2018-10-16 ENCOUNTER — Encounter: Payer: Self-pay | Admitting: Internal Medicine

## 2018-10-16 ENCOUNTER — Encounter: Payer: Self-pay | Admitting: Orthopedic Surgery

## 2018-10-16 ENCOUNTER — Other Ambulatory Visit: Payer: Self-pay

## 2018-10-16 ENCOUNTER — Other Ambulatory Visit: Payer: Self-pay | Admitting: *Deleted

## 2018-10-16 ENCOUNTER — Encounter: Payer: Self-pay | Admitting: Gastroenterology

## 2018-10-16 ENCOUNTER — Encounter: Payer: Self-pay | Admitting: *Deleted

## 2018-10-16 ENCOUNTER — Ambulatory Visit (INDEPENDENT_AMBULATORY_CARE_PROVIDER_SITE_OTHER): Payer: BC Managed Care – PPO | Admitting: Gastroenterology

## 2018-10-16 VITALS — BP 118/77 | HR 85 | Temp 97.0°F | Ht 66.0 in | Wt 206.6 lb

## 2018-10-16 DIAGNOSIS — K219 Gastro-esophageal reflux disease without esophagitis: Secondary | ICD-10-CM

## 2018-10-16 DIAGNOSIS — Z8619 Personal history of other infectious and parasitic diseases: Secondary | ICD-10-CM

## 2018-10-16 DIAGNOSIS — K5900 Constipation, unspecified: Secondary | ICD-10-CM | POA: Diagnosis not present

## 2018-10-16 DIAGNOSIS — Z1159 Encounter for screening for other viral diseases: Secondary | ICD-10-CM

## 2018-10-16 MED ORDER — PANTOPRAZOLE SODIUM 40 MG PO TBEC: 40.0000 mg | DELAYED_RELEASE_TABLET | Freq: Two times a day (BID) | ORAL | 5 refills | Status: DC

## 2018-10-16 NOTE — Assessment & Plan Note (Addendum)
Historically, patient would only have reflux occasionally and take Zantac as needed. Acute worsening of symptoms about 1 month ago. Was diagnosed with H. Pylori and treated with clarithromycin, amoxicillin, and Protonix. She has completed treatment and continues with uncontrolled reflux symptoms. Currently taking Protonix 40 mg daily, pepcid at night, carafate, and Nexium as needed. Symptoms are worse in the morning. Associated with intermittent nausea daily and vomiting about twice a week, was vomiting daily prior to H. Pylor treatment.  Denies epigastric pain, dysphagia, hematemesis, brbpr, or melena. No unintentional weight loss. Taking Mobic daily for years for knee pain for which she is having surgery on 10/31/18.   Query whether or not patients H. Pylori has been eradicated. She may also have gastritis, esophagitis, duodenitis, or PUD secondary to chronic Mobic use. Dietary habits/lifestyle may also be playing a role.   Patient needs EGD for uncontrolled GERD and to confirm H. Pylori eradication. Proceed with EGD with propofol with Dr. Gala Romney in the near future. The risks, benefits, and alternatives have been discussed in detail with patient. They have stated understanding and desire to proceed.  Stop Nexium and Pepcid.  Start Protonix 40 mg BID.  Decrease use of Mobic.  Try Tylenol for knee pain instead. Avoid NSAIDs GERD diet. Handout provided. Eat 3-4 small meals daily and ensure largest meal is not in the evening.  Do not eat within 3 to 4 hours of laying down.  Prop head of bed up on bricks to help minimize reflux during the night. Follow-up in 3-4 months. Call if concerns prior.

## 2018-10-16 NOTE — Patient Instructions (Addendum)
We will get you scheduled for an upper endoscopy with Dr. Gala Romney in the near future.  I am having you placed on the cancellation list to hopefully have your procedure sooner than later.  Please stop Nexium and Pepcid.  Start Protonix 40 mg twice daily.  You should take this 30 minutes before breakfast and 30 minutes before dinner.  Please try to decrease use of Mobic.  Try Tylenol for knee pain instead.  Please follow GERD diet.  See handout below.  You should eat 3-4 small meals daily and ensure your largest meal is not in the evening.  Do not eat within 3 to 4 hours of laying down.  You should prop the head of your bed up on bricks to help minimize reflux during the night.  For constipation, please add daily fiber supplement.  Metamucil or Benefiber are good choices.  Use MiraLAX daily as needed for constipation.  Goal of bowel movement every day to every other day.  See constipation handout below.  We will plan to follow-up with you in 3-4 months.  Aliene Altes, PA-C [facility]       Food Choices for Gastroesophageal Reflux Disease, Adult When you have gastroesophageal reflux disease (GERD), the foods you eat and your eating habits are very important. Choosing the right foods can help ease your discomfort. Think about working with a nutrition specialist (dietitian) to help you make good choices. What are tips for following this plan?  Meals  Choose healthy foods that are low in fat, such as fruits, vegetables, whole grains, low-fat dairy products, and lean meat, fish, and poultry.  Eat small meals often instead of 3 large meals a day. Eat your meals slowly, and in a place where you are relaxed. Avoid bending over or lying down until 2-3 hours after eating.  Avoid eating meals 2-3 hours before bed.  Avoid drinking a lot of liquid with meals.  Cook foods using methods other than frying. Bake, grill, or broil food instead.  Avoid or  limit: ? Chocolate. ? Peppermint or spearmint. ? Alcohol. ? Pepper. ? Black and decaffeinated coffee. ? Black and decaffeinated tea. ? Bubbly (carbonated) soft drinks. ? Caffeinated energy drinks and soft drinks.  Limit high-fat foods such as: ? Fatty meat or fried foods. ? Whole milk, cream, butter, or ice cream. ? Nuts and nut butters. ? Pastries, donuts, and sweets made with butter or shortening.  Avoid foods that cause symptoms. These foods may be different for everyone. Common foods that cause symptoms include: ? Tomatoes. ? Oranges, lemons, and limes. ? Peppers. ? Spicy food. ? Onions and garlic. ? Vinegar. Lifestyle  Maintain a healthy weight. Ask your doctor what weight is healthy for you. If you need to lose weight, work with your doctor to do so safely.  Exercise for at least 30 minutes for 5 or more days each week, or as told by your doctor.  Wear loose-fitting clothes.  Do not smoke. If you need help quitting, ask your doctor.  Sleep with the head of your bed higher than your feet. Use a wedge under the mattress or blocks under the bed frame to raise the head of the bed. Summary  When you have gastroesophageal reflux disease (GERD), food and lifestyle choices are very important in easing your symptoms.  Eat small meals often instead of 3 large meals a day. Eat your meals slowly, and in a place where you are relaxed.  Limit high-fat foods such as fatty meat  or fried foods.  Avoid bending over or lying down until 2-3 hours after eating.  Avoid peppermint and spearmint, caffeine, alcohol, and chocolate. This information is not intended to replace advice given to you by your health care provider. Make sure you discuss any questions you have with your health care provider. Document Released: 06/27/2011 Document Revised: 04/18/2018 Document Reviewed: 02/01/2016 Elsevier Patient Education  2020 [employer].  Constipation, Adult Constipation is when a  person:  Poops (has a bowel movement) fewer times in a week than normal.  Has a hard time pooping.  Has poop that is dry, hard, or bigger than normal. Follow these instructions at home: Eating and drinking   Eat foods that have a lot of fiber, such as: ? Fresh fruits and vegetables. ? Whole grains. ? Beans.  Eat less of foods that are high in fat, low in fiber, or overly processed, such as: ? [place] fries. ? Hamburgers. ? Cookies. ? Candy. ? Soda.  Drink enough fluid to keep your pee (urine) clear or pale yellow. General instructions  Exercise regularly or as told by your doctor.  Go to the restroom when you feel like you need to poop. Do not hold it in.  Take over-the-counter and prescription medicines only as told by your doctor. These include any fiber supplements.  Do pelvic floor retraining exercises, such as: ? Doing deep breathing while relaxing your lower belly (abdomen). ? Relaxing your pelvic floor while pooping.  Watch your condition for any changes.  Keep all follow-up visits as told by your doctor. This is important. Contact a doctor if:  You have pain that gets worse.  You have a fever.  You have not pooped for 4 days.  You throw up (vomit).  You are not hungry.  You lose weight.  You are bleeding from the anus.  You have thin, pencil-like poop (stool). Get help right away if:  You have a fever, and your symptoms suddenly get worse.  You leak poop or have blood in your poop.  Your belly feels hard or bigger than normal (is bloated).  You have very bad belly pain.  You feel dizzy or you faint. This information is not intended to replace advice given to you by your health care provider. Make sure you discuss any questions you have with your health care provider. Document Released: 06/14/2007 Document Revised: 12/08/2016 Document Reviewed: 06/16/2015 Elsevier Patient Education  2020 [employer].

## 2018-10-16 NOTE — Addendum Note (Signed)
Addended by: Cheron Every on: 10/16/2018 11:03 AM   Modules accepted: Orders

## 2018-10-16 NOTE — Assessment & Plan Note (Signed)
History of intranasal cocaine use. None in 20 years. Never screened for Hep C. Will order Hep C Ab.

## 2018-10-16 NOTE — Assessment & Plan Note (Signed)
Reports chronic constipation with BMs every 3-4 days. One the days she has BMs, stools hard initially followed a few looser stools. Straining often. No bright red blood per rectum. No unintentional weight loss. Not currently taking anything to help with constipation.  No prior TCS. No family history of colon cancer or colon polyps.   Add daily fiber supplement.  Metamucil or Benefiber. Use MiraLAX daily as needed for constipation.  Goal of bowel movement every day to every other day. Constipation handout provided. Patient will need TCS, but trying to get EGD as soon as possible to evaluate her upper GI symptoms. Will plan to schedule TCS af next office visit as she has no lower GI alarm symptoms.  Follow-up in 3-4 months

## 2018-10-16 NOTE — Assessment & Plan Note (Addendum)
Patient reports recent diagnosis of H. Pylori about 3 weeks ago. She has completed treatment with clarithromycin, amoxicillin, and Protonix. Prior to diagnosis, patient has worsening GERD symptoms. Historically, would only have reflux occasionally. Since completion of H. Pylori treatment, patient continues with reflux daily. Currently taking Protonix 40 mg daily, pepcid at night, Carafate, and Nexium as needed. Symptoms are worse in the morning. Associated with intermittent nausea daily and vomiting about twice a week, was vomiting daily prior to H. Pylor treatment.  Denies epigastric pain, dysphagia, hematemesis, brbpr, or melena. No unintentional weight loss. Taking Mobic daily for years for knee pain for which she is having surgery on 10/31/18.   Query whether or not patients H. Pylori has been eradicated. She may also have gastritis, esophagitis, duodenitis, or PUD secondary to chronic Mobic use.   Patient needs EGD for uncontrolled GERD and to confirm H. Pylori eradication. Proceed with EGD with propofol with Dr. Gala Romney in the near future. The risks, benefits, and alternatives have been discussed in detail with patient. They have stated understanding and desire to proceed.  Stop Nexium and Pepcid.  Start Protonix 40 mg BID.  Decrease use of Mobic.  Try Tylenol for knee pain instead. Avoid NSAIDs GERD diet. Handout provided. Eat 3-4 small meals daily and ensure largest meal is not in the evening.  Do not eat within 3 to 4 hours of laying down.  Prop head of bed up on bricks to help minimize reflux during the night. Follow-up in 3-4 months. Call if concerns prior.

## 2018-10-17 LAB — HEPATITIS C ANTIBODY
Hepatitis C Ab: NONREACTIVE
SIGNAL TO CUT-OFF: 0 (ref <1.00)

## 2018-10-17 NOTE — Progress Notes (Signed)
Hep C Ab non-reactive.

## 2018-10-23 NOTE — Patient Instructions (Signed)
Ashley Dickson  10/23/2018     @PREFPERIOPPHARMACY @   Your procedure is scheduled on  10/31/2018 .  Report to [facility] at  [phone]  A.M.  Call this number if you have problems the morning of surgery:  [phone]   Remember:  Do not eat or drink after midnight.                          Take these medicines the morning of surgery with A SIP OF WATER  Wellbutrin, chlorathalidone, lexapro, mobic, protonix.    Do not wear jewelry, make-up or nail polish.  Do not wear lotions, powders, or perfume. Please wear deodorant and brush your teeth.  Do not shave 48 hours prior to surgery.  Men may shave face and neck.  Do not bring valuables to the hospital.  [facility] is not responsible for any belongings or valuables.  Contacts, dentures or bridgework may not be worn into surgery.  Leave your suitcase in the car.  After surgery it may be brought to your room.  For patients admitted to the hospital, discharge time will be determined by your treatment team.  Patients discharged the day of surgery will not be allowed to drive home.   Name and phone number of your driver:   family Special instructions:  None  Please read over the following fact sheets that you were given. Anesthesia Post-op Instructions and Care and Recovery After Surgery       Arthroscopic Knee Ligament Repair, Care After This sheet gives you information about how to care for yourself after your procedure. Your health care provider may also give you more specific instructions. If you have problems or questions, contact your health care provider. What can I expect after the procedure? After the procedure, it is common to have:  Pain in your knee.  Bruising and swelling on your knee, calf, and ankle for 3-4 days.  Fatigue. Follow these instructions at home: If you have a brace or immobilizer:  Wear the brace or immobilizer as told by your health care provider. Remove it only as told by your  health care provider.  Loosen the splint or immobilizer if your toes tingle, become numb, or turn cold and blue.  Keep the brace or immobilizer clean. Bathing  Do not take baths, swim, or use a hot tub until your health care provider approves. Ask your health care provider if you can take showers.  Keep your bandage (dressing) dry until your health care provider says that it can be removed. Cover it and your brace or immobilizer with a watertight covering when you take a shower. Incision care   Follow instructions from your health care provider about how to take care of your incision. Make sure you: ? Wash your hands with soap and water before you change your bandage (dressing). If soap and water are not available, use hand sanitizer. ? Change your dressing as told by your health care provider. ? Leave stitches (sutures), skin glue, or adhesive strips in place. These skin closures may need to stay in place for 2 weeks or longer. If adhesive strip edges start to loosen and curl up, you may trim the loose edges. Do not remove adhesive strips completely unless your health care provider tells you to do that.  Check your incision area every day for signs of infection. Check for: ? More redness, swelling, or pain. ?  More fluid or blood. ? Warmth. ? Pus or a bad smell. Managing pain, stiffness, and swelling   If directed, put ice on the affected area. ? If you have a removable brace or immobilizer, remove it as told by your health care provider. ? Put ice in a plastic bag. ? Place a towel between your skin and the bag or between your brace or immobilizer and the bag. ? Leave the ice on for 20 minutes, 2-3 times a day.  Move your toes often to avoid stiffness and to lessen swelling.  Raise (elevate) the injured area above the level of your heart while you are sitting or lying down. Driving  Do not drive until your health care provider approves. If you have a brace or immobilizer on your  leg, ask your health care provider when it is safe for you to drive.  Do not drive or use heavy machinery while taking prescription pain medicine. Activity  Rest as directed. Ask your health care provider what activities are safe for you.  Do physical therapy exercises as told by your health care provider. Physical therapy will help you regain strength and motion in your knee.  Follow instructions from your health care provider about: ? When you may start motion exercises. ? When you may start riding a stationary bike and doing other low-impact activities. ? When you may start to jog and do other high-impact activities. Safety  Do not use the injured limb to support your body weight until your health care provider says that you can. Use crutches as told by your health care provider. General instructions  Do not use any products that contain nicotine or tobacco, such as cigarettes and e-cigarettes. These can delay bone healing. If you need help quitting, ask your health care provider.  To prevent or treat constipation while you are taking prescription pain medicine, your health care provider may recommend that you: ? Drink enough fluid to keep your urine clear or pale yellow. ? Take over-the-counter or prescription medicines. ? Eat foods that are high in fiber, such as fresh fruits and vegetables, whole grains, and beans. ? Limit foods that are high in fat and processed sugars, such as fried and sweet foods.  Take over-the-counter and prescription medicines only as told by your health care provider.  Keep all follow-up visits as told by your health care provider. This is important. Contact a health care provider if:  You have more redness, swelling, or pain around an incision.  You have more fluid or blood coming from an incision.  Your incision feels warm to the touch.  You have a fever.  You have pain or swelling in your knee, and it gets worse.  You have pain that does not  get better with medicine. Get help right away if:  You have trouble breathing.  You have pus or a bad smell coming from an incision.  You have numbness and tingling near the knee joint. Summary  After the procedure, it is common to have knee pain with bruising and swelling on your knee, calf, and ankle.  Icing your knee and raising your leg above the level of your heart will help control the pain and the swelling.  Do physical therapy exercises as told by your health care provider. Physical therapy will help you regain strength and motion in your knee. This information is not intended to replace advice given to you by your health care provider. Make sure you discuss any questions you  have with your health care provider. Document Released: 10/16/2012 Document Revised: 12/08/2016 Document Reviewed: 12/21/2015 Elsevier Patient Education  2020 [employer]  General Anesthesia, Adult, Care After This sheet gives you information about how to care for yourself after your procedure. Your health care provider may also give you more specific instructions. If you have problems or questions, contact your health care provider. What can I expect after the procedure? After the procedure, the following side effects are common:  Pain or discomfort at the IV site.  Nausea.  Vomiting.  Sore throat.  Trouble concentrating.  Feeling cold or chills.  Weak or tired.  Sleepiness and fatigue.  Soreness and body aches. These side effects can affect parts of the body that were not involved in surgery. Follow these instructions at home:  For at least 24 hours after the procedure:  Have a responsible adult stay with you. It is important to have someone help care for you until you are awake and alert.  Rest as needed.  Do not: ? Participate in activities in which you could fall or become injured. ? Drive. ? Use heavy machinery. ? Drink alcohol. ? Take sleeping pills or medicines that cause  drowsiness. ? Make important decisions or sign legal documents. ? Take care of children on your own. Eating and drinking  Follow any instructions from your health care provider about eating or drinking restrictions.  When you feel hungry, start by eating small amounts of foods that are soft and easy to digest (bland), such as toast. Gradually return to your regular diet.  Drink enough fluid to keep your urine pale yellow.  If you vomit, rehydrate by drinking water, juice, or clear broth. General instructions  If you have sleep apnea, surgery and certain medicines can increase your risk for breathing problems. Follow instructions from your health care provider about wearing your sleep device: ? Anytime you are sleeping, including during daytime naps. ? While taking prescription pain medicines, sleeping medicines, or medicines that make you drowsy.  Return to your normal activities as told by your health care provider. Ask your health care provider what activities are safe for you.  Take over-the-counter and prescription medicines only as told by your health care provider.  If you smoke, do not smoke without supervision.  Keep all follow-up visits as told by your health care provider. This is important. Contact a health care provider if:  You have nausea or vomiting that does not get better with medicine.  You cannot eat or drink without vomiting.  You have pain that does not get better with medicine.  You are unable to pass urine.  You develop a skin rash.  You have a fever.  You have redness around your IV site that gets worse. Get help right away if:  You have difficulty breathing.  You have chest pain.  You have blood in your urine or stool, or you vomit blood. Summary  After the procedure, it is common to have a sore throat or nausea. It is also common to feel tired.  Have a responsible adult stay with you for the first 24 hours after general anesthesia. It is  important to have someone help care for you until you are awake and alert.  When you feel hungry, start by eating small amounts of foods that are soft and easy to digest (bland), such as toast. Gradually return to your regular diet.  Drink enough fluid to keep your urine pale yellow.  Return to your  normal activities as told by your health care provider. Ask your health care provider what activities are safe for you. This information is not intended to replace advice given to you by your health care provider. Make sure you discuss any questions you have with your health care provider. Document Released: 04/03/2000 Document Revised: 12/29/2016 Document Reviewed: 08/11/2016 Elsevier Patient Education  2020 [employer]. How to Use Chlorhexidine for Bathing Chlorhexidine gluconate (CHG) is a germ-killing (antiseptic) solution that is used to clean the skin. It can get rid of the bacteria that normally live on the skin and can keep them away for about 24 hours. To clean your skin with CHG, you may be given:  A CHG solution to use in the shower or as part of a sponge bath.  A prepackaged cloth that contains CHG. Cleaning your skin with CHG may help lower the risk for infection:  While you are staying in the intensive care unit of the hospital.  If you have a vascular access, such as a central line, to provide short-term or long-term access to your veins.  If you have a catheter to drain urine from your bladder.  If you are on a ventilator. A ventilator is a machine that helps you breathe by moving air in and out of your lungs.  After surgery. What are the risks? Risks of using CHG include:  A skin reaction.  Hearing loss, if CHG gets in your ears.  Eye injury, if CHG gets in your eyes and is not rinsed out.  The CHG product catching fire. Make sure that you avoid smoking and flames after applying CHG to your skin. Do not use CHG:  If you have a chlorhexidine allergy or have  previously reacted to chlorhexidine.  On babies younger than 78 months of age. How to use CHG solution  Use CHG only as told by your health care provider, and follow the instructions on the label.  Use the full amount of CHG as directed. Usually, this is one bottle. During a shower Follow these steps when using CHG solution during a shower (unless your health care provider gives you different instructions): 1. Start the shower. 2. Use your normal soap and shampoo to wash your face and hair. 3. Turn off the shower or move out of the shower stream. 4. Pour the CHG onto a clean washcloth. Do not use any type of brush or rough-edged sponge. 5. Starting at your neck, lather your body down to your toes. Make sure you follow these instructions: ? If you will be having surgery, pay special attention to the part of your body where you will be having surgery. Scrub this area for at least 1 minute. ? Do not use CHG on your head or face. If the solution gets into your ears or eyes, rinse them well with water. ? Avoid your genital area. ? Avoid any areas of skin that have broken skin, cuts, or scrapes. ? Scrub your back and under your arms. Make sure to wash skin folds. 6. Let the lather sit on your skin for 1-2 minutes or as long as told by your health care provider. 7. Thoroughly rinse your entire body in the shower. Make sure that all body creases and crevices are rinsed well. 8. Dry off with a clean towel. Do not put any substances on your body afterward-such as powder, lotion, or perfume-unless you are told to do so by your health care provider. Only use lotions that are recommended by  the manufacturer. 9. Put on clean clothes or pajamas. 10. If it is the night before your surgery, sleep in clean sheets.  During a sponge bath Follow these steps when using CHG solution during a sponge bath (unless your health care provider gives you different instructions): 1. Use your normal soap and shampoo to  wash your face and hair. 2. Pour the CHG onto a clean washcloth. 3. Starting at your neck, lather your body down to your toes. Make sure you follow these instructions: ? If you will be having surgery, pay special attention to the part of your body where you will be having surgery. Scrub this area for at least 1 minute. ? Do not use CHG on your head or face. If the solution gets into your ears or eyes, rinse them well with water. ? Avoid your genital area. ? Avoid any areas of skin that have broken skin, cuts, or scrapes. ? Scrub your back and under your arms. Make sure to wash skin folds. 4. Let the lather sit on your skin for 1-2 minutes or as long as told by your health care provider. 5. Using a different clean, wet washcloth, thoroughly rinse your entire body. Make sure that all body creases and crevices are rinsed well. 6. Dry off with a clean towel. Do not put any substances on your body afterward-such as powder, lotion, or perfume-unless you are told to do so by your health care provider. Only use lotions that are recommended by the manufacturer. 7. Put on clean clothes or pajamas. 8. If it is the night before your surgery, sleep in clean sheets. How to use CHG prepackaged cloths  Only use CHG cloths as told by your health care provider, and follow the instructions on the label.  Use the CHG cloth on clean, dry skin.  Do not use the CHG cloth on your head or face unless your health care provider tells you to.  When washing with the CHG cloth: ? Avoid your genital area. ? Avoid any areas of skin that have broken skin, cuts, or scrapes. Before surgery Follow these steps when using a CHG cloth to clean before surgery (unless your health care provider gives you different instructions): 1. Using the CHG cloth, vigorously scrub the part of your body where you will be having surgery. Scrub using a back-and-forth motion for 3 minutes. The area on your body should be completely wet with CHG  when you are done scrubbing. 2. Do not rinse. Discard the cloth and let the area air-dry. Do not put any substances on the area afterward, such as powder, lotion, or perfume. 3. Put on clean clothes or pajamas. 4. If it is the night before your surgery, sleep in clean sheets.  For general bathing Follow these steps when using CHG cloths for general bathing (unless your health care provider gives you different instructions). 1. Use a separate CHG cloth for each area of your body. Make sure you wash between any folds of skin and between your fingers and toes. Wash your body in the following order, switching to a new cloth after each step: ? The front of your neck, shoulders, and chest. ? Both of your arms, under your arms, and your hands. ? Your stomach and groin area, avoiding the genitals. ? Your right leg and foot. ? Your left leg and foot. ? The back of your neck, your back, and your buttocks. 2. Do not rinse. Discard the cloth and let the area air-dry. Do  not put any substances on your body afterward-such as powder, lotion, or perfume-unless you are told to do so by your health care provider. Only use lotions that are recommended by the manufacturer. 3. Put on clean clothes or pajamas. Contact a health care provider if:  Your skin gets irritated after scrubbing.  You have questions about using your solution or cloth. Get help right away if:  Your eyes become very red or swollen.  Your eyes itch badly.  Your skin itches badly and is red or swollen.  Your hearing changes.  You have trouble seeing.  You have swelling or tingling in your mouth or throat.  You have trouble breathing.  You swallow any chlorhexidine. Summary  Chlorhexidine gluconate (CHG) is a germ-killing (antiseptic) solution that is used to clean the skin. Cleaning your skin with CHG may help to lower your risk for infection.  You may be given CHG to use for bathing. It may be in a bottle or in a prepackaged  cloth to use on your skin. Carefully follow your health care provider's instructions and the instructions on the product label.  Do not use CHG if you have a chlorhexidine allergy.  Contact your health care provider if your skin gets irritated after scrubbing. This information is not intended to replace advice given to you by your health care provider. Make sure you discuss any questions you have with your health care provider. Document Released: 09/20/2011 Document Revised: 03/14/2018 Document Reviewed: 11/23/2016 Elsevier Patient Education  2020 [employer].

## 2018-10-29 ENCOUNTER — Other Ambulatory Visit (HOSPITAL_COMMUNITY)
Admission: RE | Admit: 2018-10-29 | Discharge: 2018-10-29 | Disposition: A | Discharge status: Home / Self Care | Payer: BC Managed Care – PPO | Source: Ambulatory Visit | Attending: Orthopedic Surgery | Admitting: Orthopedic Surgery

## 2018-10-29 ENCOUNTER — Other Ambulatory Visit: Payer: Self-pay

## 2018-10-29 ENCOUNTER — Encounter (HOSPITAL_COMMUNITY): Payer: Self-pay

## 2018-10-29 ENCOUNTER — Encounter (HOSPITAL_COMMUNITY)
Admission: RE | Admit: 2018-10-29 | Discharge: 2018-10-29 | Disposition: A | Discharge status: Home / Self Care | Payer: BC Managed Care – PPO | Source: Ambulatory Visit | Attending: Orthopedic Surgery | Admitting: Orthopedic Surgery

## 2018-10-29 DIAGNOSIS — E118 Type 2 diabetes mellitus with unspecified complications: Secondary | ICD-10-CM | POA: Diagnosis not present

## 2018-10-29 DIAGNOSIS — Z01818 Encounter for other preprocedural examination: Secondary | ICD-10-CM | POA: Diagnosis not present

## 2018-10-29 HISTORY — DX: Anxiety disorder, unspecified: F41.9

## 2018-10-29 HISTORY — DX: Unspecified osteoarthritis, unspecified site: M19.90

## 2018-10-29 HISTORY — DX: Sleep apnea, unspecified: G47.30

## 2018-10-29 HISTORY — DX: Gastro-esophageal reflux disease without esophagitis: K21.9

## 2018-10-29 HISTORY — DX: Personal history of urinary calculi: Z87.442

## 2018-10-29 LAB — CBC WITH DIFFERENTIAL/PLATELET
Abs Immature Granulocytes: 0.02 10*3/uL (ref 0.00–0.07)
Basophils Absolute: 0.1 10*3/uL (ref 0.0–0.1)
Basophils Relative: 1 %
Eosinophils Absolute: 0.2 10*3/uL (ref 0.0–0.5)
Eosinophils Relative: 3 %
HCT: 37.9 % (ref 36.0–46.0)
Hemoglobin: 12.5 g/dL (ref 12.0–15.0)
Immature Granulocytes: 0 %
Lymphocytes Relative: 30 %
Lymphs Abs: 2.1 10*3/uL (ref 0.7–4.0)
MCH: 31.4 pg (ref 26.0–34.0)
MCHC: 33 g/dL (ref 30.0–36.0)
MCV: 95.2 fL (ref 80.0–100.0)
Monocytes Absolute: 0.6 10*3/uL (ref 0.1–1.0)
Monocytes Relative: 8 %
Neutro Abs: 4.2 10*3/uL (ref 1.7–7.7)
Neutrophils Relative %: 58 %
Platelets: 290 10*3/uL (ref 150–400)
RBC: 3.98 MIL/uL (ref 3.87–5.11)
RDW: 12.8 % (ref 11.5–15.5)
WBC: 7.2 10*3/uL (ref 4.0–10.5)
nRBC: 0 % (ref 0.0–0.2)

## 2018-10-29 LAB — BASIC METABOLIC PANEL
Anion gap: 14 (ref 5–15)
BUN: 22 mg/dL — ABNORMAL HIGH (ref 6–20)
CO2: 22 mmol/L (ref 22–32)
Calcium: 10 mg/dL (ref 8.9–10.3)
Chloride: 102 mmol/L (ref 98–111)
Creatinine, Ser: 1.46 mg/dL — ABNORMAL HIGH (ref 0.44–1.00)
GFR calc Af Amer: 46 mL/min — ABNORMAL LOW (ref >60)
GFR calc non Af Amer: 40 mL/min — ABNORMAL LOW (ref >60)
Glucose, Bld: 123 mg/dL — ABNORMAL HIGH (ref 70–99)
Potassium: 3.5 mmol/L (ref 3.5–5.1)
Sodium: 138 mmol/L (ref 135–145)

## 2018-10-29 LAB — HEMOGLOBIN A1C
Hgb A1c MFr Bld: 6.8 % — ABNORMAL HIGH (ref 4.8–5.6)
Mean Plasma Glucose: 148.46 mg/dL

## 2018-10-29 LAB — SARS CORONAVIRUS 2 (TAT 6-24 HRS): SARS Coronavirus 2: NEGATIVE

## 2018-10-30 NOTE — H&P (Signed)
Progress Note    Patient ID: Ashley Dickson, female   DOB: [birth year], 58 y.o.   MRN: [id number]         Chief Complaint  Patient presents with  . Follow-up      Recheck on left knee.          Encounter Diagnoses  Name Primary?  . Chronic pain of left knee Yes  . 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  She had 6 weeks of physical therapy still has pain in her left knee.  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  The pain started in the left knee about 15 years ago when she had what she describes as a MCL injury which was treated with a brace at the [facility].  She says she babied her knee for 15 years and did well   Last Saturday she felt a cramp in the back of the knee got up something popped she felt acute pain.  She treated herself with ice and CBD cream and by Sunday morning she was better however on Tuesday she twisted her left knee felt another pop on the medial side of the knee with acute onset of pain swelling decreased range of motion and she presents with a locked knee with inability to extend the knee.   She went to the ER last night x-ray was negative except for some mild arthritis of the medial compartment she was given a knee immobilizer and started on Voltaren with no improvement   No change from June 24 Review of Systems  Cardiovascular: Positive for leg swelling.  Gastrointestinal: Positive for heartburn.  Musculoskeletal: Positive for back pain and joint pain.  Endo/Heme/Allergies: Positive for environmental allergies.  Psychiatric/Behavioral: Positive for depression.  All other systems reviewed and are negative.     Past Medical  History:  Diagnosis Date  . Anxiety   . Arthritis   . Diabetes mellitus without complication (HCC)   . GERD (gastroesophageal reflux disease)   . H pylori ulcer   . History of kidney stones   . Migraines   . Renal disorder   . Shingles   . Sleep apnea    not using CPAP.   Past Surgical History:  Procedure Laterality Date  . CHOLECYSTECTOMY    . CYSTOSCOPY     with stone extraction-multiple  . excision of bone spur Bilateral    heels  . TUBAL LIGATION     Family History  Problem Relation Age of Onset  . Colon cancer Neg Hx   . Colon polyps Neg Hx    Social History   Tobacco Use  . Smoking status: Never Smoker  . Smokeless tobacco: Never Used  Substance Use Topics  . Alcohol use: Yes    Comment: rarely  . Drug use: Not Currently    Types: Cocaine    Comment: Last used 20 years ago.         BP 107/70   Pulse 87   Temp 98.2 F (36.8 C)   Ht 5\' 6"  (1.676 m)   Wt 210 lb (95.3 kg)   BMI 33.89 kg/m    Physical  Exam Vitals signs and nursing note reviewed.    Constitutional:      Appearance: Normal appearance.  Oriented x3  Mood and affect normal  Gait normal with a slight limp Station normal  Upper extremities normal range of motion all joints stable strength and muscle tone normal without tremor skin normal in normal alignment   Musculoskeletal:     Comments: Left knee effusion Medial tenderness Decreased range of motion Normal strength No instability Positive Mc[place]ray   Lymph nodes negative in the lower extremities  Overall sensation is intact no pathologic reflexes coordination and balance normal      Medical decisions:    Imaging:    Initial plain film no acute fracture  MRI IMPRESSION: 1. Complex tear of the posterior medial meniscus extending to the root junction with extrusion of the mid body. 2. Nondisplaced tear of the anterior lateral meniscus 3. Intact cruciate ligaments 4. 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 Moderate knee joint effusion 6. Loculated popliteal cyst with evidence of recent partial rupture 7. Muscular edema within the medial head of the gastrocnemius.   Electronically Signed   By: Prudencio Pair M.D.   On: 09/26/2018 10:20    Diagnosis torn medial meniscus, Torn lateral meniscus anterior horn, 3 compartment osteoarthritis  The procedure has been fully reviewed with the patient; The risks and benefits of surgery have been discussed and explained and understood. Alternative treatment has also been reviewed, questions were encouraged and answered. The postoperative plan is also been reviewed.   PLAN:    Arthroscopy left knee medial meniscectomy lateral meniscectomy

## 2018-10-31 ENCOUNTER — Ambulatory Visit (HOSPITAL_COMMUNITY): Payer: BC Managed Care – PPO | Admitting: Anesthesiology

## 2018-10-31 ENCOUNTER — Ambulatory Visit (HOSPITAL_COMMUNITY)
Admission: RE | Admit: 2018-10-31 | Discharge: 2018-10-31 | Disposition: A | Discharge status: Home / Self Care | Payer: BC Managed Care – PPO | Attending: Orthopedic Surgery | Admitting: Orthopedic Surgery

## 2018-10-31 ENCOUNTER — Encounter (HOSPITAL_COMMUNITY)
Admission: RE | Disposition: A | Discharge status: Home / Self Care | Payer: Self-pay | Source: Home / Self Care | Attending: Orthopedic Surgery

## 2018-10-31 DIAGNOSIS — M1712 Unilateral primary osteoarthritis, left knee: Secondary | ICD-10-CM | POA: Diagnosis not present

## 2018-10-31 DIAGNOSIS — G4730 Sleep apnea, unspecified: Secondary | ICD-10-CM | POA: Diagnosis not present

## 2018-10-31 DIAGNOSIS — M23322 Other meniscus derangements, posterior horn of medial meniscus, left knee: Secondary | ICD-10-CM | POA: Diagnosis not present

## 2018-10-31 DIAGNOSIS — M94262 Chondromalacia, left knee: Secondary | ICD-10-CM | POA: Diagnosis not present

## 2018-10-31 DIAGNOSIS — K219 Gastro-esophageal reflux disease without esophagitis: Secondary | ICD-10-CM | POA: Insufficient documentation

## 2018-10-31 DIAGNOSIS — S83232A Complex tear of medial meniscus, current injury, left knee, initial encounter: Secondary | ICD-10-CM | POA: Insufficient documentation

## 2018-10-31 DIAGNOSIS — I10 Essential (primary) hypertension: Secondary | ICD-10-CM | POA: Insufficient documentation

## 2018-10-31 DIAGNOSIS — S83282A Other tear of lateral meniscus, current injury, left knee, initial encounter: Secondary | ICD-10-CM | POA: Diagnosis not present

## 2018-10-31 DIAGNOSIS — M2242 Chondromalacia patellae, left knee: Secondary | ICD-10-CM | POA: Diagnosis not present

## 2018-10-31 DIAGNOSIS — E119 Type 2 diabetes mellitus without complications: Secondary | ICD-10-CM | POA: Insufficient documentation

## 2018-10-31 DIAGNOSIS — Z886 Allergy status to analgesic agent status: Secondary | ICD-10-CM | POA: Insufficient documentation

## 2018-10-31 DIAGNOSIS — X501XXA Overexertion from prolonged static or awkward postures, initial encounter: Secondary | ICD-10-CM | POA: Diagnosis not present

## 2018-10-31 DIAGNOSIS — M25762 Osteophyte, left knee: Secondary | ICD-10-CM | POA: Diagnosis not present

## 2018-10-31 HISTORY — PX: KNEE ARTHROSCOPY WITH LATERAL MENISECTOMY: SHX6193

## 2018-10-31 HISTORY — PX: CHONDROPLASTY: SHX5177

## 2018-10-31 LAB — GLUCOSE, CAPILLARY: Glucose-Capillary: 143 mg/dL — ABNORMAL HIGH (ref 70–99)

## 2018-10-31 SURGERY — ARTHROSCOPY, KNEE, WITH LATERAL MENISCECTOMY
Anesthesia: General | Site: Knee | Laterality: Left

## 2018-10-31 MED ORDER — LIDOCAINE HCL (CARDIAC) PF 50 MG/5ML IV SOSY
PREFILLED_SYRINGE | INTRAVENOUS | Status: DC | PRN
  Administered 2018-10-31 (×2): 50 mg via INTRAVENOUS

## 2018-10-31 MED ORDER — BUPIVACAINE-EPINEPHRINE (PF) 0.5% -1:200000 IJ SOLN
INTRAMUSCULAR | Status: AC
  Filled 2018-10-31: qty 60

## 2018-10-31 MED ORDER — MIDAZOLAM HCL 2 MG/2ML IJ SOLN: 0.5000 mg | Freq: Once | INTRAMUSCULAR | Status: DC | PRN

## 2018-10-31 MED ORDER — PROPOFOL 10 MG/ML IV BOLUS
INTRAVENOUS | Status: AC
  Filled 2018-10-31: qty 40

## 2018-10-31 MED ORDER — LACTATED RINGERS IV SOLN
INTRAVENOUS | Status: DC
  Administered 2018-10-31: 07:00:00 via INTRAVENOUS

## 2018-10-31 MED ORDER — FENTANYL CITRATE (PF) 250 MCG/5ML IJ SOLN
INTRAMUSCULAR | Status: AC
  Filled 2018-10-31: qty 5

## 2018-10-31 MED ORDER — FENTANYL CITRATE (PF) 100 MCG/2ML IJ SOLN
INTRAMUSCULAR | Status: DC | PRN
  Administered 2018-10-31 (×5): 50 ug via INTRAVENOUS

## 2018-10-31 MED ORDER — ONDANSETRON HCL 4 MG/2ML IJ SOLN
4.0000 mg | Freq: Once | INTRAMUSCULAR | Status: AC
  Administered 2018-10-31: 4 mg via INTRAVENOUS

## 2018-10-31 MED ORDER — EPINEPHRINE PF 1 MG/ML IJ SOLN
INTRAMUSCULAR | Status: AC
  Filled 2018-10-31: qty 1

## 2018-10-31 MED ORDER — MIDAZOLAM HCL 5 MG/5ML IJ SOLN
INTRAMUSCULAR | Status: DC | PRN
  Administered 2018-10-31: 2 mg via INTRAVENOUS

## 2018-10-31 MED ORDER — SODIUM CHLORIDE 0.9 % IR SOLN
Status: DC | PRN
  Administered 2018-10-31 (×4): 3000 mL

## 2018-10-31 MED ORDER — PROMETHAZINE HCL 12.5 MG PO TABS: 12.5000 mg | ORAL_TABLET | Freq: Four times a day (QID) | ORAL | 0 refills | Status: DC | PRN

## 2018-10-31 MED ORDER — PROMETHAZINE HCL 25 MG/ML IJ SOLN: 6.2500 mg | INTRAMUSCULAR | Status: DC | PRN

## 2018-10-31 MED ORDER — HYDROCODONE-ACETAMINOPHEN 5-325 MG PO TABS: 1.0000 | ORAL_TABLET | ORAL | 0 refills | Status: DC | PRN

## 2018-10-31 MED ORDER — MIDAZOLAM HCL 2 MG/2ML IJ SOLN
INTRAMUSCULAR | Status: AC
  Filled 2018-10-31: qty 2

## 2018-10-31 MED ORDER — CHLORHEXIDINE GLUCONATE 4 % EX LIQD: 60.0000 mL | Freq: Once | CUTANEOUS | Status: DC

## 2018-10-31 MED ORDER — PROPOFOL 10 MG/ML IV BOLUS
INTRAVENOUS | Status: DC | PRN
  Administered 2018-10-31: 175 mg via INTRAVENOUS

## 2018-10-31 MED ORDER — ONDANSETRON HCL 4 MG/2ML IJ SOLN
INTRAMUSCULAR | Status: AC
  Filled 2018-10-31: qty 2

## 2018-10-31 MED ORDER — CEFAZOLIN SODIUM-DEXTROSE 2-4 GM/100ML-% IV SOLN
2.0000 g | INTRAVENOUS | Status: AC
  Administered 2018-10-31: 2 g via INTRAVENOUS
  Filled 2018-10-31: qty 100

## 2018-10-31 MED ORDER — 0.9 % SODIUM CHLORIDE (POUR BTL) OPTIME
TOPICAL | Status: DC | PRN
  Administered 2018-10-31: 1000 mL

## 2018-10-31 MED ORDER — SUCCINYLCHOLINE 20MG/ML (10ML) SYRINGE FOR MEDFUSION PUMP - OPTIME
INTRAMUSCULAR | Status: DC | PRN
  Administered 2018-10-31: 100 mg via INTRAVENOUS

## 2018-10-31 MED ORDER — HYDROCODONE-ACETAMINOPHEN 5-325 MG PO TABS
ORAL_TABLET | ORAL | Status: AC
  Filled 2018-10-31: qty 1

## 2018-10-31 MED ORDER — EPINEPHRINE PF 1 MG/ML IJ SOLN
INTRAMUSCULAR | Status: AC
  Filled 2018-10-31: qty 5

## 2018-10-31 MED ORDER — HYDROCODONE-ACETAMINOPHEN 5-325 MG PO TABS
1.0000 | ORAL_TABLET | Freq: Once | ORAL | Status: AC
  Administered 2018-10-31: 09:00:00 1 via ORAL

## 2018-10-31 MED ORDER — ONDANSETRON HCL 4 MG/2ML IJ SOLN
INTRAMUSCULAR | Status: DC | PRN
  Administered 2018-10-31: 4 mg via INTRAVENOUS

## 2018-10-31 MED ORDER — BUPIVACAINE-EPINEPHRINE (PF) 0.5% -1:200000 IJ SOLN
INTRAMUSCULAR | Status: DC | PRN
  Administered 2018-10-31 (×2): 30 mL

## 2018-10-31 SURGICAL SUPPLY — 47 items
APL PRP STRL LF DISP 70% ISPRP (MISCELLANEOUS) ×1
BANDAGE ELASTIC 6 VELCRO NS (GAUZE/BANDAGES/DRESSINGS) ×3 IMPLANT
BLADE 11 SAFETY STRL DISP (BLADE) ×3 IMPLANT
BLADE AGGRESSIVE PLUS 4.0 (BLADE) ×3 IMPLANT
CHLORAPREP W/TINT 26 (MISCELLANEOUS) ×4 IMPLANT
CLOTH BEACON ORANGE TIMEOUT ST (SAFETY) ×3 IMPLANT
COOLER CRYO IC GRAV AND TUBE (ORTHOPEDIC SUPPLIES) ×3 IMPLANT
CUFF CRYO KNEE18X23 MED (MISCELLANEOUS) ×2 IMPLANT
CUFF TOURN SGL QUICK 34 (TOURNIQUET CUFF) ×3
CUFF TRNQT CYL 34X4.125X (TOURNIQUET CUFF) IMPLANT
DECANTER SPIKE VIAL GLASS SM (MISCELLANEOUS) ×6 IMPLANT
GAUZE 4X4 16PLY RFD (DISPOSABLE) ×3 IMPLANT
GAUZE SPONGE 4X4 12PLY STRL (GAUZE/BANDAGES/DRESSINGS) ×3 IMPLANT
GAUZE XEROFORM 5X9 LF (GAUZE/BANDAGES/DRESSINGS) ×3 IMPLANT
GLOVE BIOGEL PI IND STRL 7.0 (GLOVE) ×1 IMPLANT
GLOVE BIOGEL PI INDICATOR 7.0 (GLOVE) ×2
GLOVE ECLIPSE 6.5 STRL STRAW (GLOVE) ×2 IMPLANT
GLOVE SKINSENSE NS SZ8.0 LF (GLOVE) ×2
GLOVE SKINSENSE STRL SZ8.0 LF (GLOVE) ×1 IMPLANT
GLOVE SS N UNI LF 8.5 STRL (GLOVE) ×3 IMPLANT
GOWN STRL REUS W/TWL LRG LVL3 (GOWN DISPOSABLE) ×3 IMPLANT
GOWN STRL REUS W/TWL XL LVL3 (GOWN DISPOSABLE) ×3 IMPLANT
HLDR LEG FOAM (MISCELLANEOUS) ×1 IMPLANT
IV NS IRRIG 3000ML ARTHROMATIC (IV SOLUTION) ×10 IMPLANT
KIT BLADEGUARD II DBL (SET/KITS/TRAYS/PACK) ×3 IMPLANT
KIT TURNOVER CYSTO (KITS) ×3 IMPLANT
LEG HOLDER FOAM (MISCELLANEOUS) ×2
MANIFOLD NEPTUNE II (INSTRUMENTS) ×3 IMPLANT
MARKER SKIN DUAL TIP RULER LAB (MISCELLANEOUS) ×3 IMPLANT
NDL HYPO 18GX1.5 BLUNT FILL (NEEDLE) ×1 IMPLANT
NDL HYPO 21X1.5 SAFETY (NEEDLE) ×1 IMPLANT
NDL SPNL 18GX3.5 QUINCKE PK (NEEDLE) ×1 IMPLANT
NEEDLE HYPO 18GX1.5 BLUNT FILL (NEEDLE) ×3 IMPLANT
NEEDLE HYPO 21X1.5 SAFETY (NEEDLE) ×3 IMPLANT
NEEDLE SPNL 18GX3.5 QUINCKE PK (NEEDLE) ×3 IMPLANT
NS IRRIG 1000ML POUR BTL (IV SOLUTION) ×3 IMPLANT
PACK ARTHRO LIMB DRAPE STRL (MISCELLANEOUS) ×3 IMPLANT
PAD ABD 5X9 TENDERSORB (GAUZE/BANDAGES/DRESSINGS) ×3 IMPLANT
PAD ARMBOARD 7.5X6 YLW CONV (MISCELLANEOUS) ×3 IMPLANT
PADDING CAST COTTON 6X4 STRL (CAST SUPPLIES) ×3 IMPLANT
SET ARTHROSCOPY INST (INSTRUMENTS) ×3 IMPLANT
SET BASIN LINEN APH (SET/KITS/TRAYS/PACK) ×3 IMPLANT
SUT ETHILON 3 0 FSL (SUTURE) ×3 IMPLANT
SYR 30ML LL (SYRINGE) ×3 IMPLANT
TUBE CONNECTING 12'X1/4 (SUCTIONS) ×2
TUBE CONNECTING 12X1/4 (SUCTIONS) ×4 IMPLANT
TUBING ARTHRO INFLOW-ONLY STRL (TUBING) ×3 IMPLANT

## 2018-10-31 NOTE — Discharge Instructions (Signed)

## 2018-10-31 NOTE — Interval H&P Note (Signed)
History and Physical Interval Note:  10/31/2018 7:20 AM   BP 118/82   Pulse 81   Temp 98.1 F (36.7 C) (Oral)   Resp 16   Ht 5\' 6"  (1.676 m)   Wt 92.1 kg   SpO2 99%   BMI 32.76 kg/m   CBC Latest Ref Rng & Units 10/29/2018 03/16/2015 02/16/2015  WBC 4.0 - 10.5 K/uL 7.2 11.1(H) 9.6  Hemoglobin 12.0 - 15.0 g/dL 12.5 14.0 13.5  Hematocrit 36.0 - 46.0 % 37.9 40.9 39.3  Platelets 150 - 400 K/uL 290 267 264     Ashley Dickson  has presented today for surgery, with the diagnosis of torn lateral meniscus and medial meniscus left knee.  The various methods of treatment have been discussed with the patient and family. After consideration of risks, benefits and other options for treatment, the patient has consented to  Procedure(s): LEFT KNEE ARTHROSCOPY WITH LATERAL MENISCECTOMY AND MEDIAL MENISCECTOMY (Left) as a surgical intervention.  The patient's history has been reviewed, patient examined, no change in status, stable for surgery.  I have reviewed the patient's chart and labs.  Questions were answered to the patient's satisfaction.     Arther Abbott

## 2018-10-31 NOTE — Brief Op Note (Signed)
10/31/2018  8:35 AM  PATIENT:  Ashley Dickson  58 y.o. female  PRE-OPERATIVE DIAGNOSIS:  torn lateral meniscus and medial meniscus left knee  POST-OPERATIVE DIAGNOSIS:  torn  medial meniscus left knee  PROCEDURE:  Procedure(s): LEFT KNEE ARTHROSCOPY WITH MEDIAL MENISCECTOMY (Left) 29881  Findings at surgery  The patient had a grade 3 lesion of her patella at the median ridge.  There are osteophytes around the medial femoral condyle.  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  She had a posterior root tear of the medial meniscus.  The ACL and PCL were intact.  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  The surgical site was confirmed and marked.  She was then taken to the operating room for general anesthesia and then she was given 2 g of Ancef IV.  The left leg was prepped and draped in the usual sterile manner  Timeout was completed  The portals were injected with Marcaine with epinephrine and a lateral portal was established the scope was placed into the joint and diagnostic arthroscopy was performed  The findings are listed above.  The medial side was addressed first.  Medial portal was established dilated with a blunt instrument.  Straight biting arthroscopic forcep was used to debride the posterior horn medial meniscal tear after probing revealed the root tear and a displaceable fragment.  This was then contoured with a motorized shaver and meniscal fragments were removed with the same instrument  The scope was then placed into the medial portal and the lateral meniscus was not torn it was frayed and there was a large piece of synovium which appeared to be related to an intra-articular cyst which was  debrided with a straight shaver.  The scope was then placed back in the lateral portal and a shaver was used to debride the patella chondromalacia and a femoral chondromalacia  The joint was irrigated suctioned dry and injected with Marcaine with epinephrine 60 cc total  Portals were closed with 1, 3-0 nylon suture   Sterile dressings and Cryo/Cuff were applied  The patient went to recovery room after extubation in stable condition ASSISTANTS: none   ANESTHESIA:   general  EBL:  10 mL   BLOOD ADMINISTERED:none  DRAINS: none   LOCAL MEDICATIONS USED:  MARCAINE     SPECIMEN:  No Specimen  DISPOSITION OF SPECIMEN:  N/A  COUNTS:  YES  TOURNIQUET: No tourniquet  DICTATION: .Dragon Dictation  PLAN OF CARE: Discharge to home after PACU  PATIENT DISPOSITION:  PACU - hemodynamically stable.   Delay start of Pharmacological VTE agent (>24hrs) due to surgical blood loss or risk of bleeding: not applicable

## 2018-10-31 NOTE — Transfer of Care (Signed)
Immediate Anesthesia Transfer of Care Note  Patient: Ashley Dickson  Procedure(s) Performed: LEFT KNEE ARTHROSCOPY WITH MEDIAL MENISCECTOMY (Left Knee)  Patient Location: PACU  Anesthesia Type:General  Level of Consciousness: awake  Airway & Oxygen Therapy: Patient Spontanous Breathing  Post-op Assessment: Report given to RN  Post vital signs: Reviewed and stable  Last Vitals:  Vitals Value Taken Time  BP 118/79 10/31/18 0845  Temp    Pulse 92 10/31/18 0847  Resp 14 10/31/18 0847  SpO2 97 % 10/31/18 0847  Vitals shown include unvalidated device data.  Last Pain:  Vitals:   10/31/18 0708  TempSrc: Oral  PainSc: 0-No pain      Patients Stated Pain Goal: 8 (67/61/95 0932)  Complications: No apparent anesthesia complications

## 2018-10-31 NOTE — Op Note (Addendum)
10/31/2018  8:35 AM  PATIENT:  Ashley Dickson  58 y.o. female  PRE-OPERATIVE DIAGNOSIS:  torn lateral meniscus and medial meniscus left knee  POST-OPERATIVE DIAGNOSIS:  torn  medial meniscus left knee  PROCEDURE:  Procedure(s): LEFT KNEE ARTHROSCOPY WITH MEDIAL MENISCECTOMY (Left) 29881 Chondroplasty medial femoral condyle Chondroplasty patella   Findings at surgery  The patient had a grade 3 lesion of her patella at the median ridge.  There are osteophytes around the medial femoral condyle.  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  She had a posterior root tear of the medial meniscus.  The ACL and PCL were intact.  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  The surgical site was confirmed and marked.  She was then taken to the operating room for general anesthesia and then she was given 2 g of Ancef IV.  The left leg was prepped and draped in the usual sterile manner  Timeout was completed  The portals were injected with Marcaine with epinephrine and a lateral portal was established the scope was placed into the joint and diagnostic arthroscopy was performed  The findings are listed above.  The medial side was addressed first.  Medial portal was established dilated with a blunt instrument.  Straight biting arthroscopic forcep was used to debride the posterior horn medial meniscal tear after probing revealed the root tear and a displaceable fragment.  This was then contoured with a motorized shaver and meniscal fragments were removed with the same instrument  The scope was then placed into the medial portal and the lateral meniscus was not torn it was frayed and there was a large piece of synovium which  appeared to be related to an intra-articular cyst which was debrided with a straight shaver.  The scope was then placed back in the lateral portal and a shaver was used to debride the patella chondromalacia and a femoral chondromalacia  The joint was irrigated suctioned dry and injected with Marcaine with epinephrine 60 cc total  Portals were closed with 1, 3-0 nylon suture   Sterile dressings and Cryo/Cuff were applied  The patient went to recovery room after extubation in stable condition ASSISTANTS: none   ANESTHESIA:   general  EBL:  10 mL   BLOOD ADMINISTERED:none  DRAINS: none   LOCAL MEDICATIONS USED:  MARCAINE     SPECIMEN:  No Specimen  DISPOSITION OF SPECIMEN:  N/A  COUNTS:  YES  TOURNIQUET: No tourniquet  DICTATION: .Dragon Dictation  PLAN OF CARE: Discharge to home after PACU  PATIENT DISPOSITION:  PACU - hemodynamically stable.   Delay start of Pharmacological VTE agent (>24hrs) due to surgical blood loss or risk of bleeding: not applicable

## 2018-10-31 NOTE — Anesthesia Procedure Notes (Signed)
Procedure Name: Intubation Date/Time: 10/31/2018 7:38 AM Performed by: Ollen Bowl, CRNA Pre-anesthesia Checklist: Patient identified, Patient being monitored, Timeout performed, Emergency Drugs available and Suction available Patient Re-evaluated:Patient Re-evaluated prior to induction Oxygen Delivery Method: Circle system utilized Preoxygenation: Pre-oxygenation with 100% oxygen Induction Type: IV induction Ventilation: Mask ventilation without difficulty Laryngoscope Size: Mac and 3 Grade View: Grade I Tube type: Oral Tube size: 7.0 mm Number of attempts: 1 Airway Equipment and Method: Stylet Placement Confirmation: ETT inserted through vocal cords under direct vision,  positive ETCO2 and breath sounds checked- equal and bilateral Secured at: 21 cm Tube secured with: Tape Dental Injury: Teeth and Oropharynx as per pre-operative assessment

## 2018-10-31 NOTE — Anesthesia Postprocedure Evaluation (Signed)
Anesthesia Post Note  Patient: Ashley Dickson  Procedure(s) Performed: LEFT KNEE ARTHROSCOPY WITH MEDIAL MENISCECTOMY (Left Knee) CHONDROPLASTY MEDIAL FEMORAL CONDYLE AND PATELLA (Left Knee)  Patient location during evaluation: PACU Anesthesia Type: General Level of consciousness: awake and alert and oriented Pain management: pain level controlled Vital Signs Assessment: post-procedure vital signs reviewed and stable Respiratory status: spontaneous breathing Cardiovascular status: blood pressure returned to baseline and stable Postop Assessment: no apparent nausea or vomiting Anesthetic complications: no Comments: Late entry     Last Vitals:  Vitals:   10/31/18 0930 10/31/18 0937  BP: 112/75 116/78  Pulse: 74 81  Resp: 16 18  Temp:  36.8 C  SpO2: 100% 100%    Last Pain:  Vitals:   10/31/18 0937  TempSrc: Oral  PainSc: 3                  Avi Archuleta

## 2018-10-31 NOTE — Anesthesia Preprocedure Evaluation (Signed)
Anesthesia Evaluation  Patient identified by MRN, date of birth, ID band Patient awake  General Assessment Comment:Reports awareness during D&C and foot surgery   Reviewed: Allergy & Precautions, NPO status , Patient's Chart, lab work & pertinent test results  History of Anesthesia Complications (+) AWARENESS UNDER ANESTHESIA and history of anesthetic complications  Airway Mallampati: II  TM Distance: >3 FB Neck ROM: Full    Dental no notable dental hx. (+) Teeth Intact, Loose Reports one loose upper Right tooth :   Pulmonary sleep apnea ,  Reports Non compliant  with CPAP D/w pt importance of using CPAP   Pulmonary exam normal breath sounds clear to auscultation       Cardiovascular Exercise Tolerance: Good hypertension, Normal cardiovascular examI Rhythm:Regular Rate:Normal     Neuro/Psych  Headaches, Anxiety Depression negative psych ROS   GI/Hepatic Neg liver ROS, PUD, GERD  Medicated and Poorly Controlled,  Endo/Other  negative endocrine ROSdiabetes, Type 2, Oral Hypoglycemic Agents  Renal/GU Renal InsufficiencyRenal diseaseH/o stones   negative genitourinary   Musculoskeletal  (+) Arthritis , Osteoarthritis,    Abdominal   Peds negative pediatric ROS (+)  Hematology negative hematology ROS (+)   Anesthesia Other Findings   Reproductive/Obstetrics negative OB ROS                             Anesthesia Physical Anesthesia Plan  ASA: II  Anesthesia Plan: General   Post-op Pain Management:    Induction: Intravenous  PONV Risk Score and Plan: 3 and Midazolam, Ondansetron, Dexamethasone and Treatment may vary due to age or medical condition  Airway Management Planned: Oral ETT  Additional Equipment:   Intra-op Plan:   Post-operative Plan: Extubation in OR  Informed Consent: I have reviewed the patients History and Physical, chart, labs and discussed the procedure including  the risks, benefits and alternatives for the proposed anesthesia with the patient or authorized representative who has indicated his/her understanding and acceptance.     Dental advisory given  Plan Discussed with: CRNA  Anesthesia Plan Comments: (Plan Full PPE use Plan GETA D/W PT -WTP with same after Q&A)        Anesthesia Quick Evaluation

## 2018-11-01 ENCOUNTER — Encounter (HOSPITAL_COMMUNITY): Payer: Self-pay | Admitting: Orthopedic Surgery

## 2018-11-04 ENCOUNTER — Telehealth: Payer: Self-pay | Admitting: *Deleted

## 2018-11-04 NOTE — Telephone Encounter (Signed)
Patient on current cancellation list. RMR had cancellation on 11/12 at 10:00am. Pt requested to take this appointment. Patient aware will send instructions with pre-op/covid-19 testing appt to [facility] and will also mail these to her. Endo aware of appt change.

## 2018-11-07 DIAGNOSIS — Z9889 Other specified postprocedural states: Secondary | ICD-10-CM | POA: Insufficient documentation

## 2018-11-08 ENCOUNTER — Ambulatory Visit (INDEPENDENT_AMBULATORY_CARE_PROVIDER_SITE_OTHER): Payer: BC Managed Care – PPO | Admitting: Orthopedic Surgery

## 2018-11-08 ENCOUNTER — Other Ambulatory Visit: Payer: Self-pay

## 2018-11-08 ENCOUNTER — Encounter: Payer: Self-pay | Admitting: Orthopedic Surgery

## 2018-11-08 DIAGNOSIS — Z9889 Other specified postprocedural states: Secondary | ICD-10-CM

## 2018-11-08 NOTE — Progress Notes (Signed)
Post op knee arthroscopy left knee for torn medial meniscus and osteoarthritis  Patient has good pain control her range of motion is about 10-90 her portals look good with some mild swelling and bruising of the skin around the portals  Recommend home exercises for 3 weeks follow-up in 3 weeks  Encounter Diagnosis  Name Primary?  . S/P left knee arthroscopy 11/04/2018

## 2018-11-08 NOTE — Patient Instructions (Signed)
Home exercises   Ice the knee as needed

## 2018-11-11 DIAGNOSIS — E1165 Type 2 diabetes mellitus with hyperglycemia: Secondary | ICD-10-CM | POA: Diagnosis not present

## 2018-11-11 DIAGNOSIS — E782 Mixed hyperlipidemia: Secondary | ICD-10-CM | POA: Diagnosis not present

## 2018-11-11 DIAGNOSIS — I10 Essential (primary) hypertension: Secondary | ICD-10-CM | POA: Diagnosis not present

## 2018-11-14 DIAGNOSIS — E782 Mixed hyperlipidemia: Secondary | ICD-10-CM | POA: Diagnosis not present

## 2018-11-14 DIAGNOSIS — Z0189 Encounter for other specified special examinations: Secondary | ICD-10-CM | POA: Diagnosis not present

## 2018-11-14 DIAGNOSIS — E1165 Type 2 diabetes mellitus with hyperglycemia: Secondary | ICD-10-CM | POA: Diagnosis not present

## 2018-11-14 DIAGNOSIS — M1990 Unspecified osteoarthritis, unspecified site: Secondary | ICD-10-CM | POA: Diagnosis not present

## 2018-11-19 ENCOUNTER — Encounter (HOSPITAL_COMMUNITY)
Admission: RE | Admit: 2018-11-19 | Discharge: 2018-11-19 | Disposition: A | Discharge status: Home / Self Care | Payer: BC Managed Care – PPO | Source: Ambulatory Visit | Attending: Internal Medicine | Admitting: Internal Medicine

## 2018-11-19 ENCOUNTER — Other Ambulatory Visit (HOSPITAL_COMMUNITY)
Admission: RE | Admit: 2018-11-19 | Discharge: 2018-11-19 | Disposition: A | Discharge status: Home / Self Care | Payer: BC Managed Care – PPO | Source: Ambulatory Visit | Attending: Internal Medicine | Admitting: Internal Medicine

## 2018-11-19 ENCOUNTER — Encounter (HOSPITAL_COMMUNITY): Payer: Self-pay

## 2018-11-19 ENCOUNTER — Other Ambulatory Visit: Payer: Self-pay

## 2018-11-19 DIAGNOSIS — Z20828 Contact with and (suspected) exposure to other viral communicable diseases: Secondary | ICD-10-CM | POA: Diagnosis not present

## 2018-11-19 DIAGNOSIS — Z01812 Encounter for preprocedural laboratory examination: Secondary | ICD-10-CM | POA: Insufficient documentation

## 2018-11-19 LAB — SARS CORONAVIRUS 2 (TAT 6-24 HRS): SARS Coronavirus 2: NEGATIVE

## 2018-11-19 NOTE — Patient Instructions (Signed)
    Ashley Dickson  11/19/2018     @PREFPERIOPPHARMACY @   Your procedure is scheduled on Thursday, 11/21/18.  Report to [facility] at 0830 A.M.  Call this number if you have problems the morning of surgery:  [phone]   Remember:  Do not eat or drink after midnight.     Take these medicines the morning of surgery with A SIP OF WATER wellbutrin, lexapro, hydrocodone, mobic, protonix, phenergan    Do not wear jewelry, make-up or nail polish.  Do not wear lotions, powders, or perfumes, or deodorant.  Do not shave 48 hours prior to surgery.  Men may shave face and neck.  Do not bring valuables to the hospital.  [facility] is not responsible for any belongings or valuables.  Contacts, dentures or bridgework may not be worn into surgery.  Leave your suitcase in the car.  After surgery it may be brought to your room.  For patients admitted to the hospital, discharge time will be determined by your treatment team.  Patients discharged the day of surgery will not be allowed to drive home.   Name and phone number of your driver:   family  Please read over the following fact sheets that you were given. Anesthesia Post-op Instructions       Upper Endoscopy, Adult, Care After This sheet gives you information about how to care for yourself after your procedure. Your health care provider may also give you more specific instructions. If you have problems or questions, contact your health care provider. What can I expect after the procedure? After the procedure, it is common to have:  A sore throat.  Mild stomach pain or discomfort.  Bloating.  Nausea. Follow these instructions at home:   Follow instructions from your health care provider about what to eat or drink after your procedure.  Return to your normal activities as told by your health care provider. Ask your health care provider what activities are safe for you.  Take over-the-counter and prescription medicines  only as told by your health care provider.  Do not drive for 24 hours if you were given a sedative during your procedure.  Keep all follow-up visits as told by your health care provider. This is important. Contact a health care provider if you have:  A sore throat that lasts longer than one day.  Trouble swallowing. Get help right away if:  You vomit blood or your vomit looks like coffee grounds.  You have: ? A fever. ? Bloody, black, or tarry stools. ? A severe sore throat or you cannot swallow. ? Difficulty breathing. ? Severe pain in your chest or abdomen. Summary  After the procedure, it is common to have a sore throat, mild stomach discomfort, bloating, and nausea.  Do not drive for 24 hours if you were given a sedative during the procedure.  Follow instructions from your health care provider about what to eat or drink after your procedure.  Return to your normal activities as told by your health care provider. This information is not intended to replace advice given to you by your health care provider. Make sure you discuss any questions you have with your health care provider. Document Released: 06/27/2011 Document Revised: 06/19/2017 Document Reviewed: 05/28/2017 Elsevier Patient Education  2020 [employer].

## 2018-11-21 ENCOUNTER — Ambulatory Visit (HOSPITAL_COMMUNITY): Payer: BC Managed Care – PPO | Admitting: Anesthesiology

## 2018-11-21 ENCOUNTER — Encounter (HOSPITAL_COMMUNITY): Payer: Self-pay | Admitting: Anesthesiology

## 2018-11-21 ENCOUNTER — Ambulatory Visit (HOSPITAL_COMMUNITY)
Admission: RE | Admit: 2018-11-21 | Discharge: 2018-11-21 | Disposition: A | Discharge status: Home / Self Care | Payer: BC Managed Care – PPO | Source: Ambulatory Visit | Attending: Internal Medicine | Admitting: Internal Medicine

## 2018-11-21 ENCOUNTER — Encounter (HOSPITAL_COMMUNITY)
Admission: RE | Disposition: A | Discharge status: Home / Self Care | Payer: Self-pay | Source: Ambulatory Visit | Attending: Internal Medicine

## 2018-11-21 DIAGNOSIS — R12 Heartburn: Secondary | ICD-10-CM | POA: Diagnosis not present

## 2018-11-21 DIAGNOSIS — G4730 Sleep apnea, unspecified: Secondary | ICD-10-CM | POA: Insufficient documentation

## 2018-11-21 DIAGNOSIS — K219 Gastro-esophageal reflux disease without esophagitis: Secondary | ICD-10-CM | POA: Insufficient documentation

## 2018-11-21 DIAGNOSIS — Z79899 Other long term (current) drug therapy: Secondary | ICD-10-CM | POA: Insufficient documentation

## 2018-11-21 DIAGNOSIS — Z8619 Personal history of other infectious and parasitic diseases: Secondary | ICD-10-CM

## 2018-11-21 DIAGNOSIS — Z7984 Long term (current) use of oral hypoglycemic drugs: Secondary | ICD-10-CM | POA: Insufficient documentation

## 2018-11-21 DIAGNOSIS — M1990 Unspecified osteoarthritis, unspecified site: Secondary | ICD-10-CM | POA: Diagnosis not present

## 2018-11-21 DIAGNOSIS — F419 Anxiety disorder, unspecified: Secondary | ICD-10-CM | POA: Diagnosis not present

## 2018-11-21 DIAGNOSIS — E119 Type 2 diabetes mellitus without complications: Secondary | ICD-10-CM | POA: Insufficient documentation

## 2018-11-21 HISTORY — PX: ESOPHAGOGASTRODUODENOSCOPY (EGD) WITH PROPOFOL: SHX5813

## 2018-11-21 LAB — GLUCOSE, CAPILLARY: Glucose-Capillary: 141 mg/dL — ABNORMAL HIGH (ref 70–99)

## 2018-11-21 SURGERY — ESOPHAGOGASTRODUODENOSCOPY (EGD) WITH PROPOFOL
Anesthesia: General

## 2018-11-21 MED ORDER — PROPOFOL 10 MG/ML IV BOLUS
INTRAVENOUS | Status: AC
  Filled 2018-11-21: qty 40

## 2018-11-21 MED ORDER — CHLORHEXIDINE GLUCONATE CLOTH 2 % EX PADS: 6.0000 | MEDICATED_PAD | Freq: Once | CUTANEOUS | Status: DC

## 2018-11-21 MED ORDER — KETAMINE HCL 50 MG/5ML IJ SOSY
PREFILLED_SYRINGE | INTRAMUSCULAR | Status: AC
  Filled 2018-11-21: qty 5

## 2018-11-21 MED ORDER — PROPOFOL 500 MG/50ML IV EMUL
INTRAVENOUS | Status: DC | PRN
  Administered 2018-11-21: 150 ug/kg/min via INTRAVENOUS

## 2018-11-21 MED ORDER — LACTATED RINGERS IV SOLN
INTRAVENOUS | Status: DC
  Administered 2018-11-21: 07:00:00 via INTRAVENOUS

## 2018-11-21 MED ORDER — PROPOFOL 10 MG/ML IV BOLUS
INTRAVENOUS | Status: DC | PRN
  Administered 2018-11-21 (×2): 20 mg via INTRAVENOUS

## 2018-11-21 MED ORDER — MIDAZOLAM HCL 2 MG/2ML IJ SOLN: 0.5000 mg | Freq: Once | INTRAMUSCULAR | Status: DC | PRN

## 2018-11-21 MED ORDER — PROMETHAZINE HCL 25 MG/ML IJ SOLN: 6.2500 mg | INTRAMUSCULAR | Status: DC | PRN

## 2018-11-21 NOTE — Op Note (Signed)
[facility] Patient Name: Ashley BarrSheryl Dickson Procedure Date: 11/21/2018 7:02 AM MRN: [id number] Date of Birth: 06/24/1960 Attending MD: Gennette Pacobert Michael Cosby Proby , MD CSN: [id number] Age: 58 Admit Type: Outpatient Procedure:                Upper GI endoscopy Indications:              Heartburn Providers:                Gennette Pacobert Michael Deryl Giroux, MD, Buel ReamAngela A. Thomasena Edisollins RN, RN,                            Dyann Ruddleonya Wilson Referring MD:             Kathleene HazelJohn Z. Hall MD Medicines:                Propofol per Anesthesia Complications:            No immediate complications. Estimated Blood Loss:     Estimated blood loss: none. Procedure:                Pre-Anesthesia Assessment:                           - Prior to the procedure, a History and Physical                            was performed, and patient medications and                            allergies were reviewed. The patient's tolerance of                            previous anesthesia was also reviewed. The risks                            and benefits of the procedure and the sedation                            options and risks were discussed with the patient.                            All questions were answered, and informed consent                            was obtained. Prior Anticoagulants: The patient has                            taken no previous anticoagulant or antiplatelet                            agents. ASA Grade Assessment: II - A patient with                            mild systemic disease. After reviewing the risks  and benefits, the patient was deemed in                            satisfactory condition to undergo the procedure.                           After obtaining informed consent, the endoscope was                            passed under direct vision. Throughout the                            procedure, the patient's blood pressure, pulse, and                            oxygen saturations  were monitored continuously. The                            GIF-H190 ([id number]) scope was introduced through the                            mouth, and advanced to the body of the stomach. The                            upper GI endoscopy was accomplished without                            difficulty. The patient tolerated the procedure                            well. Scope In: 7:39:00 AM Scope Out: 7:40:00 AM Total Procedure Duration: 0 hours 1 minute 0 seconds  Findings:      The examined esophagus was normal. Upon entering the stomach, a large       amount of retained gastric contents was failed precluding further       examination. The exam was then concluded. Impression:               - Normal esophagus. Full of food. Exam incomplete.                            Patient states last meal included a bologna                            sandwich and some fruit approximately 13 hours                            prior to this procedure.                           - No specimens collected. Moderate Sedation:      Moderate (conscious) sedation was personally administered by an       anesthesia professional. The following parameters were monitored: oxygen       saturation, heart rate, blood pressure, respiratory rate, EKG, adequacy  of pulmonary ventilation, and response to care. Recommendation:           - Patient has a contact number available for                            emergencies. The signs and symptoms of potential                            delayed complications were discussed with the                            patient. Return to normal activities tomorrow.                            Written discharge instructions were provided to the                            patient.                           - Continue present medications. Tinea Protonix 40                            mg twice daily which has helped her reflux symptoms                            considerably                            - Advance diet as tolerated.                           - Return to my office in 4 weeks. Proceed with a                            gastric emptying study in the near future. Procedure Code(s):        --- Professional ---                           [phone], 52, Esophagogastroduodenoscopy, flexible,                            transoral; diagnostic, including collection of                            specimen(s) by brushing or washing, when performed                            (separate procedure) Diagnosis Code(s):        --- Professional ---                           R12, Heartburn CPT copyright 2019 American [employer]. All rights reserved. The codes documented in this report are preliminary and upon coder review may  be revised to meet current compliance requirements. Gerrit Friends. Jena Gauss, MD Ashley Dickson  Ashley Sessions, MD 11/21/2018 7:55:18 AM This report has been signed electronically. Number of Addenda: 0

## 2018-11-21 NOTE — H&P (Signed)
@LOGO @   Primary Care Physician:  Celene Squibb, MD Primary Gastroenterologist:  Dr. Gala Romney  Pre-Procedure History & Physical: HPI:  Ashley Dickson is a 58 y.o. female here for further evaluation of GERD.  Denies dysphagia.  She is going on Protonix 40 mg twice daily symptoms have improved significantly Past Medical History:  Diagnosis Date  . Anxiety   . Arthritis   . Diabetes mellitus without complication ([place])   . GERD (gastroesophageal reflux disease)   . H pylori ulcer   . History of kidney stones   . Migraines   . Renal disorder   . Shingles   . Sleep apnea    not using CPAP.    Past Surgical History:  Procedure Laterality Date  . CHOLECYSTECTOMY    . CHONDROPLASTY Left 10/31/2018   Procedure: CHONDROPLASTY MEDIAL FEMORAL CONDYLE AND PATELLA;  Surgeon: Carole Civil, MD;  Location: [facility];  Service: Orthopedics;  Laterality: Left;  . CYSTOSCOPY     with stone extraction-multiple  . excision of bone spur Bilateral    heels  . KNEE ARTHROSCOPY WITH LATERAL MENISECTOMY Left 10/31/2018   Procedure: LEFT KNEE ARTHROSCOPY WITH MEDIAL MENISCECTOMY;  Surgeon: Carole Civil, MD;  Location: [facility];  Service: Orthopedics;  Laterality: Left;  . TUBAL LIGATION      Prior to Admission medications   Medication Sig Start Date End Date Taking? Authorizing Provider  buPROPion (WELLBUTRIN XL) 300 MG 24 hr tablet Take 300 mg by mouth daily.   Yes [provider]  chlorthalidone (HYGROTON) 25 MG tablet Take 25 mg by mouth daily.   Yes [provider]  glipiZIDE (GLUCOTROL) 10 MG tablet Take 10 mg by mouth daily. 09/17/18  Yes [provider]  HYDROcodone-acetaminophen (NORCO/VICODIN) 5-325 MG tablet Take 1 tablet by mouth every 4 (four) hours as needed for moderate pain. 10/31/18 10/31/19 Yes Carole Civil, MD  irbesartan (AVAPRO) 75 MG tablet Take 75 mg by mouth daily.   Yes [provider]  metFORMIN (GLUMETZA) 1000 MG (MOD) 24 hr  tablet Take 1,000 mg by mouth 2 (two) times daily with a meal.   Yes [provider]  pantoprazole (PROTONIX) 40 MG tablet Take 1 tablet (40 mg total) by mouth 2 (two) times daily before a meal. 10/16/18 10/16/19 Yes Harper, Tivis Ringer, PA-C  promethazine (PHENERGAN) 12.5 MG tablet Take 1 tablet (12.5 mg total) by mouth every 6 (six) hours as needed for nausea or vomiting. 10/31/18  Yes Carole Civil, MD  sucralfate (CARAFATE) 1 g tablet Take 1 g by mouth 2 (two) times daily.  09/23/18  Yes [provider]  atorvastatin (LIPITOR) 40 MG tablet Take 40 mg by mouth daily.  01/21/15 10/23/18  [provider]  escitalopram (LEXAPRO) 10 MG tablet Take 10 mg by mouth daily.     [provider]  meloxicam (MOBIC) 15 MG tablet Take 15 mg by mouth daily.     [provider]  Semaglutide,0.25 or 0.5MG /DOS, (OZEMPIC, 0.25 OR 0.5 MG/DOSE,) 2 MG/1.5ML SOPN Inject 0.5 mg into the skin every Thursday.     [provider]    Allergies as of 10/16/2018 - Review Complete 10/16/2018  Allergen Reaction Noted  . Azithromycin Anaphylaxis 02/16/2015  . Tramadol Itching 02/16/2015  . Ace inhibitors Cough 02/24/2015  . Sumatriptan Other (See Comments) 02/24/2015  . Voltaren [diclofenac]  10/16/2018    Family History  Problem Relation Age of Onset  . Colon cancer Neg Hx   .  Colon polyps Neg Hx     Social History   Socioeconomic History  . Marital status: Single    Spouse name: Not on file  . Number of children: Not on file  . Years of education: Not on file  . Highest education level: Not on file  Occupational History  . Not on file  Social Needs  . Financial resource strain: Not on file  . Food insecurity    Worry: Not on file    Inability: Not on file  . Transportation needs    Medical: Not on file    Non-medical: Not on file  Tobacco Use  . Smoking status: Never Smoker  . Smokeless tobacco: Never Used  Substance and Sexual Activity  .  Alcohol use: Yes    Comment: rarely  . Drug use: Not Currently    Types: Cocaine    Comment: Last used 20 years ago.   Marland Kitchen Sexual activity: Yes  Lifestyle  . Physical activity    Days per week: Not on file    Minutes per session: Not on file  . Stress: Not on file  Relationships  . Social Musician on phone: Not on file    Gets together: Not on file    Attends religious service: Not on file    Active member of club or organization: Not on file    Attends meetings of clubs or organizations: Not on file    Relationship status: Not on file  . Intimate partner violence    Fear of current or ex partner: Not on file    Emotionally abused: Not on file    Physically abused: Not on file    Forced sexual activity: Not on file  Other Topics Concern  . Not on file  Social History Narrative  . Not on file    Review of Systems: See HPI, otherwise negative ROS  Physical Exam: BP 108/79   Pulse 83   Temp 98.3 F (36.8 C) (Oral)   Resp 18   SpO2 98%  General:   Alert,  Well-developed, well-nourished, pleasant and cooperative in NAD Neck:  Supple; no masses or thyromegaly. No significant cervical adenopathy. Lungs:  Clear throughout to auscultation.   No wheezes, crackles, or rhonchi. No acute distress. Heart:  Regular rate and rhythm; no murmurs, clicks, rubs,  or gallops. Abdomen: Non-distended, normal bowel sounds.  Soft and nontender without appreciable mass or hepatosplenomegaly.  Pulses:  Normal pulses noted. Extremities:  Without clubbing or edema.  Impression/Plan:   58 year old lady with a history of refractory GERD now much improved on twice daily PPI therapy (Protonix) here for EGD to further evaluate her symptoms. The risks, benefits, limitations, alternatives and imponderables have been reviewed with the patient. Potential for esophageal dilation, biopsy, etc. have also been reviewed.  Questions have been answered. All parties agreeable.     Notice: This  dictation was prepared with Dragon dictation along with smaller phrase technology. Any transcriptional errors that result from this process are unintentional and may not be corrected upon review.

## 2018-11-21 NOTE — Anesthesia Postprocedure Evaluation (Signed)
Anesthesia Post Note  Patient: Ashley Dickson  Procedure(s) Performed: ESOPHAGOGASTRODUODENOSCOPY (EGD) WITH PROPOFOL (N/A )  Patient location during evaluation: PACU Anesthesia Type: General Level of consciousness: awake and alert and oriented Pain management: pain level controlled Vital Signs Assessment: post-procedure vital signs reviewed and stable Respiratory status: spontaneous breathing Cardiovascular status: blood pressure returned to baseline and stable Postop Assessment: no apparent nausea or vomiting Anesthetic complications: no     Last Vitals:  Vitals:   11/21/18 0700 11/21/18 0745  BP: 108/79 98/76  Pulse: 83 82  Resp: 18 16  Temp: 36.8 C 36.8 C  SpO2: 98% 98%    Last Pain:  Vitals:   11/21/18 0745  TempSrc:   PainSc: 0-No pain                 Manav Pierotti

## 2018-11-21 NOTE — Discharge Instructions (Signed)
EGD Discharge instructions Please read the instructions outlined below and refer to this sheet in the next few weeks. These discharge instructions provide you with general information on caring for yourself after you leave the hospital. Your doctor may also give you specific instructions. While your treatment has been planned according to the most current medical practices available, unavoidable complications occasionally occur. If you have any problems or questions after discharge, please call your doctor. ACTIVITY  You may resume your regular activity but move at a slower pace for the next 24 hours.   Take frequent rest periods for the next 24 hours.   Walking will help expel (get rid of) the air and reduce the bloated feeling in your abdomen.   No driving for 24 hours (because of the anesthesia (medicine) used during the test).   You may shower.   Do not sign any important legal documents or operate any machinery for 24 hours (because of the anesthesia used during the test).  NUTRITION  Drink plenty of fluids.   You may resume your normal diet.   Begin with a light meal and progress to your normal diet.   Avoid alcoholic beverages for 24 hours or as instructed by your caregiver.  MEDICATIONS  You may resume your normal medications unless your caregiver tells you otherwise.  WHAT YOU CAN EXPECT TODAY  You may experience abdominal discomfort such as a feeling of fullness or gas pains.  FOLLOW-UP  Your doctor will discuss the results of your test with you.  SEEK IMMEDIATE MEDICAL ATTENTION IF ANY OF THE FOLLOWING OCCUR:  Excessive nausea (feeling sick to your stomach) and/or vomiting.   Severe abdominal pain and distention (swelling).   Trouble swallowing.   Temperature over 101 F (37.8 C).   Rectal bleeding or vomiting of blood.    Much food remained in your stomach which kept [place] from completing your examination today  We will schedule a solid-phase gastric  emptying study in the near future to see how well your stomach empties  Continue Protonix 40 mg twice daily  Office visit with [place] in 4 weeks  At patient request, I called daughter, Ebony Hail, at [phone] and discussed impression and recommendations

## 2018-11-21 NOTE — Transfer of Care (Signed)
Immediate Anesthesia Transfer of Care Note  Patient: Ashley Dickson  Procedure(s) Performed: ESOPHAGOGASTRODUODENOSCOPY (EGD) WITH PROPOFOL (N/A )  Patient Location: PACU  Anesthesia Type:General  Level of Consciousness: awake  Airway & Oxygen Therapy: Patient Spontanous Breathing  Post-op Assessment: Report given to RN  Post vital signs: Reviewed  Last Vitals:  Vitals Value Taken Time  BP 98/76 11/21/18 0746  Temp    Pulse 84 11/21/18 0747  Resp 13 11/21/18 0747  SpO2 94 % 11/21/18 0747  Vitals shown include unvalidated device data.  Last Pain:  Vitals:   11/21/18 0737  TempSrc:   PainSc: 2       Patients Stated Pain Goal: 5 (17/91/50 5697)  Complications: No apparent anesthesia complications

## 2018-11-21 NOTE — Anesthesia Preprocedure Evaluation (Addendum)
Anesthesia Evaluation  Patient identified by MRN, date of birth, ID band Patient awake    Reviewed: Allergy & Precautions, NPO status , Patient's Chart, lab work & pertinent test results  Airway Mallampati: II  TM Distance: >3 FB Neck ROM: Full    Dental no notable dental hx. (+) Teeth Intact   Pulmonary sleep apnea ,    Pulmonary exam normal breath sounds clear to auscultation       Cardiovascular Exercise Tolerance: Good hypertension, Normal cardiovascular examI Rhythm:Regular Rate:Normal     Neuro/Psych  Headaches, Anxiety Depression negative psych ROS   GI/Hepatic Neg liver ROS, PUD, GERD  Medicated and Controlled,  Endo/Other  negative endocrine ROSdiabetes  Renal/GU Renal InsufficiencyRenal disease  negative genitourinary   Musculoskeletal negative musculoskeletal ROS (+)   Abdominal   Peds negative pediatric ROS (+)  Hematology negative hematology ROS (+)   Anesthesia Other Findings   Reproductive/Obstetrics negative OB ROS                             Anesthesia Physical Anesthesia Plan  ASA: II  Anesthesia Plan: General   Post-op Pain Management:    Induction: Intravenous  PONV Risk Score and Plan: 3 and TIVA, Propofol infusion, Ondansetron and Treatment may vary due to age or medical condition  Airway Management Planned: Nasal Cannula and Simple Face Mask  Additional Equipment:   Intra-op Plan:   Post-operative Plan:   Informed Consent: I have reviewed the patients History and Physical, chart, labs and discussed the procedure including the risks, benefits and alternatives for the proposed anesthesia with the patient or authorized representative who has indicated his/her understanding and acceptance.     Dental advisory given  Plan Discussed with: CRNA  Anesthesia Plan Comments: (Plan Full PPE use  Plan GA with GETA as needed d/w pt -WTP with same after Q&A)         Anesthesia Quick Evaluation

## 2018-11-25 ENCOUNTER — Telehealth: Payer: Self-pay | Admitting: Internal Medicine

## 2018-11-25 DIAGNOSIS — K219 Gastro-esophageal reflux disease without esophagitis: Secondary | ICD-10-CM

## 2018-11-25 NOTE — Telephone Encounter (Signed)
Per note follow up in 4 weeks. Proceed with gastric emptying study in the future.   RMR does she need this now or can this be scheduled when she comes in for OV? Thanks

## 2018-11-25 NOTE — Telephone Encounter (Signed)
PATIENT CALLED STATING RMR WANTED HER TO HAVE AN EMPTYING STUDY DONE BECAUSE SHE WAS NOT CLEAN FOR HER TCS

## 2018-11-26 NOTE — Telephone Encounter (Signed)
Yes.  Lets get the emptying study prior to her next office visit.

## 2018-11-26 NOTE — Addendum Note (Signed)
Addended by: Cheron Every on: 11/26/2018 04:25 PM   Modules accepted: Orders

## 2018-11-27 NOTE — Telephone Encounter (Signed)
GES scheduled for 11/24 at 8:00am, arrival 7:45am, npo midnight, no stomach meds after midnight  Patient aware of appt details

## 2018-11-29 ENCOUNTER — Ambulatory Visit (INDEPENDENT_AMBULATORY_CARE_PROVIDER_SITE_OTHER): Payer: BC Managed Care – PPO | Admitting: Orthopedic Surgery

## 2018-11-29 ENCOUNTER — Other Ambulatory Visit: Payer: Self-pay

## 2018-11-29 ENCOUNTER — Encounter: Payer: Self-pay | Admitting: Orthopedic Surgery

## 2018-11-29 VITALS — BP 112/77 | HR 98 | Temp 97.0°F | Ht 66.0 in | Wt 197.0 lb

## 2018-11-29 DIAGNOSIS — Z9889 Other specified postprocedural states: Secondary | ICD-10-CM

## 2018-11-29 NOTE — Patient Instructions (Signed)
Activity as tolerated

## 2018-11-29 NOTE — Progress Notes (Signed)
Postop visit Chief Complaint  Patient presents with  . Follow-up    Recheck on left knee, DOS 10-31-18.    Surgery date October 22 arthroscopy left knee medial meniscal tear with grade 3 lesion of the patella osteophytes around the medial femoral condyle grade II chondromalacia and flap tear of the medial femoral condyle root tear medial meniscus grade I chondromalacia lateral plateau lateral femoral condyle with anterior cyst without anterior lateral meniscal tear  She has improved significantly her range of motion has improved her strength is excellent she is walking unsupported there is no limp  Encounter Diagnosis  Name Primary?  . S/P left knee arthroscopy 11/04/2018 Yes    She will follow-up as needed

## 2018-12-03 ENCOUNTER — Other Ambulatory Visit: Payer: Self-pay

## 2018-12-03 ENCOUNTER — Ambulatory Visit (HOSPITAL_COMMUNITY)
Admission: RE | Admit: 2018-12-03 | Discharge: 2018-12-03 | Disposition: A | Discharge status: Home / Self Care | Payer: BC Managed Care – PPO | Source: Ambulatory Visit | Attending: Internal Medicine | Admitting: Internal Medicine

## 2018-12-03 DIAGNOSIS — R112 Nausea with vomiting, unspecified: Secondary | ICD-10-CM | POA: Diagnosis not present

## 2018-12-03 DIAGNOSIS — K219 Gastro-esophageal reflux disease without esophagitis: Secondary | ICD-10-CM | POA: Diagnosis not present

## 2018-12-03 DIAGNOSIS — R634 Abnormal weight loss: Secondary | ICD-10-CM | POA: Diagnosis not present

## 2018-12-03 MED ORDER — TECHNETIUM TC 99M SULFUR COLLOID
2.0000 | Freq: Once | INTRAVENOUS | Status: AC | PRN
  Administered 2018-12-03: 2.02 via INTRAVENOUS

## 2018-12-04 NOTE — Progress Notes (Signed)
Gastric emptying study normal.  Please let her know.  Agree with a follow-up appointment in 4 weeks.  Needs second attempt at EGD.  Needs a clear liquid lunch and supper day before procedure.

## 2018-12-11 ENCOUNTER — Telehealth: Payer: Self-pay | Admitting: Radiology

## 2018-12-11 NOTE — Progress Notes (Signed)
Referring Provider: Celene Squibb, MD Primary Care Physician:  Celene Squibb, MD Primary GI Physician: Dr. Gala Romney  Chief Complaint  Patient presents with   Gastroesophageal Reflux    "egg taste"   Nausea    occ vomiting   Diarrhea    HPI:   Ashley Dickson is a 58 y.o. female presenting today with a history of GERD, H. pylori s/p treatment in September 2020, and constipation.  Patient was last seen on 10/16/2018 for initial consult.  She had recently completed treatment for H. pylori, GERD symptoms were uncontrolled with associated intermittent nausea daily and occasional vomiting.  Had been on Mobic for years.  Constipation with BM every 3-4 days.  No prior colonoscopy.  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  Plans to proceed to EGD, increase Protonix to 40 mg twice daily, decrease use of Mobic, fiber supplement daily, MiraLAX daily, screen for hepatitis C, and follow-up in 3-4 months.  Schedule colonoscopy at next visit.  Hep C antibody negative on 10/16/2018. EGD on 11/21/2018 was incomplete due to a large amount of retained gastric contents in the stomach.  The examined esophagus was normal.  Patient reported last meal was bologna sandwich and fruit 13 hours prior to procedure.  Advised to continue Protonix twice daily and pursue gastric emptying study.  Return to office in 4 weeks.  Gastric emptying study on 12/03/2018 normal.  Today: Had knee surgery on 10/31/2018.  Last used meloxicam on October 21st.   GERD: Taking Protonix BID. Continues to have reflux symptoms daily along with belching. Egg taste is not as frequent. Last A1c was 6.8. A1C 9 in July 2020. Started on Ozempic and has lost about 30 lbs. Also changed diet. No fried foods, not eating out, limiting sweets, and no soda. Diet is also limited however due to continued nausea on and off daily. Sometimes nausea is worse with eating other times better with eating. Intermittent vomiting when she eats too much.  Feels she is getting full quicker. No dysphagia. No hematemesis. No significant abdominal pain.   Constipation: Started fiber supplement and had improvement with daily stools. Started having looser stools. Would have 3 BMs a day about every other day. Mushy. Stopped fiber supplement. Now may have a formed stool, may go without a BM a couple days, or may have watery stools. Typically watery stools are with urgency after a meal. Thinks it may be after she eats eggs. Had potato salad with eggs last Thursday and two days ago which was followed by diarrhea. Typically has a large watery stool and will be done. No frequent watery diarrhea. No diarrhea since Tuesday of this week. No blood in the stool. No black stools.   No prior colonoscopy. Doesn't think she could tolerate prep for TCS with ongoing nausea.   Past Medical History:  Diagnosis Date   Anxiety    Arthritis    Diabetes mellitus without complication ([facility])    GERD (gastroesophageal reflux disease)    H pylori ulcer    History of kidney stones    Migraines    Renal disorder    Shingles    Sleep apnea    not using CPAP.    Past Surgical History:  Procedure Laterality Date   CHOLECYSTECTOMY     CHONDROPLASTY Left 10/31/2018   Procedure: CHONDROPLASTY MEDIAL FEMORAL CONDYLE AND PATELLA;  Surgeon: Carole Civil, MD;  Location: [facility];  Service: Orthopedics;  Laterality: Left;   CYSTOSCOPY  with stone extraction-multiple   excision of bone spur Bilateral    heels   KNEE ARTHROSCOPY WITH LATERAL MENISECTOMY Left 10/31/2018   Procedure: LEFT KNEE ARTHROSCOPY WITH MEDIAL MENISCECTOMY;  Surgeon: Vickki Hearing, MD;  Location: [facility];  Service: Orthopedics;  Laterality: Left;   TUBAL LIGATION      Current Outpatient Medications  Medication Sig Dispense Refill   atorvastatin (LIPITOR) 40 MG tablet Take 40 mg by mouth daily.      buPROPion (WELLBUTRIN XL) 300 MG 24 hr tablet Take 300 mg by mouth daily.       chlorthalidone (HYGROTON) 25 MG tablet Take 25 mg by mouth daily.     escitalopram (LEXAPRO) 10 MG tablet Take 10 mg by mouth daily.      glipiZIDE (GLUCOTROL) 10 MG tablet Take 10 mg by mouth daily.     irbesartan (AVAPRO) 75 MG tablet Take 75 mg by mouth daily.     metFORMIN (GLUMETZA) 1000 MG (MOD) 24 hr tablet Take 1,000 mg by mouth 2 (two) times daily with a meal.     promethazine (PHENERGAN) 12.5 MG tablet Take 1 tablet (12.5 mg total) by mouth every 6 (six) hours as needed for nausea or vomiting. 30 tablet 0   Semaglutide,0.25 or 0.5MG /DOS, (OZEMPIC, 0.25 OR 0.5 MG/DOSE,) 2 MG/1.5ML SOPN Inject 0.5 mg into the skin every Thursday.      esomeprazole (NEXIUM) 40 MG capsule Take 1 capsule (40 mg total) by mouth 2 (two) times daily before a meal. 60 capsule 5   No current facility-administered medications for this visit.     Allergies as of 12/12/2018 - Review Complete 12/12/2018  Allergen Reaction Noted   Azithromycin Anaphylaxis 02/16/2015   Tramadol Itching 02/16/2015   Ace inhibitors Cough 02/24/2015   Sumatriptan Other (See Comments) 02/24/2015   Voltaren [diclofenac] Swelling 10/16/2018    Family History  Problem Relation Age of Onset   Colon cancer Neg Hx    Colon polyps Neg Hx     Social History   Socioeconomic History   Marital status: Single    Spouse name: Not on file   Number of children: Not on file   Years of education: Not on file   Highest education level: Not on file  Occupational History   Not on file  Social Needs   Financial resource strain: Not on file   Food insecurity    Worry: Not on file    Inability: Not on file   Transportation needs    Medical: Not on file    Non-medical: Not on file  Tobacco Use   Smoking status: Never Smoker   Smokeless tobacco: Never Used  Substance and Sexual Activity   Alcohol use: Yes    Comment: rarely   Drug use: Not Currently    Types: Cocaine    Comment: Last used 20 years ago.     Sexual activity: Yes  Lifestyle   Physical activity    Days per week: Not on file    Minutes per session: Not on file   Stress: Not on file  Relationships   Social connections    Talks on phone: Not on file    Gets together: Not on file    Attends religious service: Not on file    Active member of club or organization: Not on file    Attends meetings of clubs or organizations: Not on file    Relationship status: Not on file  Other Topics Concern  Not on file  Social History Narrative   Not on file    Review of Systems: Gen: Denies fever, chills, lightheadedness, dizziness, presyncope, or syncope. HEENT: Denies nasal congestion or sore throat. CV: Denies chest pain, palpitations Resp: Denies dyspnea or cough GI: See HPI Derm: Denies rash Psych: Denies depression, anxiety Heme: Denies bruising, bleeding  Physical Exam: BP 130/85    Pulse 88    Temp (!) 95.9 F (35.5 C) (Temporal)    Ht  (1.676 m)    Wt 201 lb 3.2 oz (91.3 kg)    BMI 32.47 kg/m  General:   Alert and oriented. No distress noted. Pleasant and cooperative.  Head:  Normocephalic and atraumatic. Eyes:  Conjuctiva clear without scleral icterus. Heart:  S1, S2 present without murmurs appreciated. Lungs:  Clear to auscultation bilaterally. No wheezes, rales, or rhonchi. No distress.  Abdomen:  +BS, soft, non-tender and non-distended. No rebound or guarding. No HSM or masses noted. Msk:  Symmetrical without gross deformities. Normal posture. Extremities:  Without edema. Neurologic:  Alert and  oriented x4 Psych:  Normal mood and affect.

## 2018-12-11 NOTE — Telephone Encounter (Signed)
Patient called, LM asking you to email her RTW note so she can print it.  Email to [email or web address].

## 2018-12-11 NOTE — Telephone Encounter (Signed)
Called back to patient - states has appointment nearby tomorrow, so will pick up note. Aware it is printed and ready.

## 2018-12-12 ENCOUNTER — Other Ambulatory Visit: Payer: Self-pay | Admitting: Gastroenterology

## 2018-12-12 ENCOUNTER — Telehealth: Payer: Self-pay | Admitting: *Deleted

## 2018-12-12 ENCOUNTER — Other Ambulatory Visit: Payer: Self-pay | Admitting: *Deleted

## 2018-12-12 ENCOUNTER — Ambulatory Visit (INDEPENDENT_AMBULATORY_CARE_PROVIDER_SITE_OTHER): Payer: BC Managed Care – PPO | Admitting: Gastroenterology

## 2018-12-12 ENCOUNTER — Encounter: Payer: Self-pay | Admitting: Gastroenterology

## 2018-12-12 ENCOUNTER — Other Ambulatory Visit: Payer: Self-pay

## 2018-12-12 ENCOUNTER — Encounter: Payer: Self-pay | Admitting: *Deleted

## 2018-12-12 VITALS — BP 130/85 | HR 88 | Temp 95.9°F | Ht 66.0 in | Wt 201.2 lb

## 2018-12-12 DIAGNOSIS — R112 Nausea with vomiting, unspecified: Secondary | ICD-10-CM

## 2018-12-12 DIAGNOSIS — Z8619 Personal history of other infectious and parasitic diseases: Secondary | ICD-10-CM

## 2018-12-12 DIAGNOSIS — K219 Gastro-esophageal reflux disease without esophagitis: Secondary | ICD-10-CM

## 2018-12-12 DIAGNOSIS — K5900 Constipation, unspecified: Secondary | ICD-10-CM

## 2018-12-12 MED ORDER — PROMETHAZINE HCL 12.5 MG PO TABS: 12.5000 mg | ORAL_TABLET | Freq: Four times a day (QID) | ORAL | 0 refills | Status: DC | PRN

## 2018-12-12 MED ORDER — ESOMEPRAZOLE MAGNESIUM 40 MG PO CPDR: 40.0000 mg | DELAYED_RELEASE_CAPSULE | Freq: Two times a day (BID) | ORAL | 5 refills | Status: DC

## 2018-12-12 NOTE — Telephone Encounter (Signed)
Spoke with pt. Medication Nexium is covered at the cost of $152. Pt has taken Omeprazole and Pantoprazole and the cost was around $4-5. Will look into to what the covered PPI medications are.

## 2018-12-12 NOTE — Telephone Encounter (Signed)
Is there any way we can find out what PPIs are covered well by her insurance?

## 2018-12-12 NOTE — Assessment & Plan Note (Addendum)
History of H. pylori diagnosed and treated by PCP in September 2020.  No documentation of eradication.  Patient continues with uncontrolled GERD symptoms, intermittent daily nausea, and occasional vomiting when eating too much.  Currently taking Protonix twice daily and Phenergan sparingly.  Attempted EGD on 11/21/2018 for evaluation of upper GI symptoms.  Exam was incomplete due to large amount of retained gastric contents in the stomach.  The examined esophagus was normal.  Follow-up gastric emptying study on 12/03/2018 was normal.  No longer taking Mobic as of 10/30/2018.   Do not suspect H. pylori as the cause of her upper GI symptoms.  Although GES normal, am still suspicious of underlying gastroparesis in the setting of diabetes.  Also on Ozempic which delayed gastric emptying.  Plans to reattempt EGD for complete evaluation due to ongoing upper GI symptoms.  Biopsies can be taken at the time of EGD to evaluate for H. pylori eradication.  Proceed with EGD with propofol with Dr. Gala Romney in the near future. The risks, benefits, and alternatives have been discussed in detail with patient. They have stated understanding and desire to proceed.  Patient will be on clears liquids the entire day prior to her EGD to ensure her stomach is empty.  Due to being on a clear liquid diet, will adjust her diabetes medications.  See separate instructions. Try switching Protonix to Nexium 40 mg twice daily to see if this helps control her reflux symptoms any better. Discussed gastroparesis diet including low-fat and low fiber diet and eating 4-6 small meals a day.   Gastroparesis diet handout provided. Refilled Phenergan prescription as this works better than Zofran.  She uses this very sparingly.  Counseled on potential for drowsiness and dizziness. Follow-up after procedure.

## 2018-12-12 NOTE — Patient Instructions (Addendum)
We will get you scheduled for upper endoscopy in the near future with Dr. Gala Romney.  You will need to be on clear liquids the entire day before your procedure.  1 day prior: 5 mg glipizide, 500 mg metformin in am and pm, continue ozempic as scheduled.  Day of: No diabetes medications the morning of.   Avoid eggs as this seems to be a trigger of intermittent diarrhea.   Use MiraLAX 1 capful (17g)daily as needed for constipation.   Follow gastroparesis diet for now to see if this will help any of your symptoms. Low fat/low fiber diet. 4-6 small meals a day. See handout.   Stop Protonix and start Nexium 40 mg twice daily 30 minutes before meals.   We will see you back after your procedure. Call if questions or concerns prior.   Aliene Altes, PA-C [facility]

## 2018-12-12 NOTE — Telephone Encounter (Signed)
Pre-op/covid testing scheduled for 12/8 at 1:55pm for covid, 2:15 for pre-op  Called patient and she is aware of appt details.

## 2018-12-12 NOTE — Assessment & Plan Note (Addendum)
43-month history of uncontrolled GERD, intermittent daily nausea, and occasional vomiting when eating too much.  Also with early satiety.  About 30 pound weight loss since June 2020.  Weight loss is multifactorial in the setting of Ozempic, dietary changes, and some decreased oral intake secondary to nausea.  EGD attempted on 11/21/2018 but was incomplete due to a large amount of retained gastric contents in the stomach.  The examined esophagus was normal.  Patient had not eaten for 13 hours prior to procedure.  Follow-up gastric emptying study on 12/03/2018 was normal.  Although GES normal, I still query whether patient may have component of gastroparesis with her current symptoms in the setting of diabetes.  Currently on Ozempic which also delayed gastric emptying.  We need to reattempt EGD for complete evaluation.  She does have history of H. pylori diagnosed and treated in September by PCP. Biopsies can be taken at the time of EGD for evaluation of H. pylori eradication.  Proceed with EGD with propofol with Dr. Gala Romney in the near future. The risks, benefits, and alternatives have been discussed in detail with patient. They have stated understanding and desire to proceed.  Patient will be on clears liquids the entire day prior to her EGD to ensure her stomach is empty.  Due to being on a clear liquid diet, will adjust her diabetes medications.  See separate instructions. We will try switching Protonix to Nexium 40 mg twice daily to see if this helps control her reflux symptoms any better. Discussed gastroparesis diet including low-fat and low fiber diet and eating 4-6 small meals a day.   Gastroparesis diet handout provided. Refilled Phenergan prescription as this works better than Zofran.  She uses this very sparingly.  Counseled on potential for drowsiness and dizziness. Follow-up after procedure.

## 2018-12-12 NOTE — Assessment & Plan Note (Addendum)
Patient reported history of chronic constipation at last visit in October 2020.  She started a fiber supplement daily which improved her constipation but she started having mushy stools, so she stopped the fiber supplement.  Bowels are fluctuating at this time.  May have a formed stool, may go a couple days without a BM, or may have a watery stool.  Watery stool seems to occur with urgency after eating eggs.  Usually only 1 large BM with diarrhea.  No frequent watery stools.  Denies bright red blood per rectum, melena, or abdominal pain.  She has lost about 30 pounds since June 2020.  Weight loss is multifactorial in the setting of Ozempic, dietary changes, and ongoing nausea that is limiting her oral intake somewhat.   I suspect patient still has underlying constipation.  Diarrhea likely due to food intolerance.    Advise she avoid eggs.   Due to concerns for possible underlying gastroparesis, I will not have patient resume a fiber supplement.   Discussed the addition of MiraLAX and titrating this with goal of BM daily to every other day.  She was advised to start 1 capful (17g) daily but may adjust the dose as needed.  She does need her first ever colonoscopy; however, due to ongoing nausea, patient does not feel she can tolerate the prep.  We will try to schedule this in the near future once her upper GI symptoms are better managed. Follow-up after EGD.

## 2018-12-12 NOTE — Assessment & Plan Note (Addendum)
58 year old female with history of GERD.  She had acute worsening of symptoms about 3 months ago.  Diagnosed with H. pylori and treated through PCP with no documentation of eradication.  She is currently on Protonix 40 mg twice daily but GERD symptoms continue to be uncontrolled.  Having reflux symptoms and belching daily.  Also with intermittent daily nausea, occasional vomiting when eating too much, and early satiety.  Denies abdominal pain, BRBPR, or melena.  She was started on Ozempic in August 2020. Lost about 30 lbs since June 2020.  Multifactorial in the setting of Ozempic, change in diet, and decreased oral intake with nausea.  No longer taking Mobic as of 10/30/2018 as she underwent knee surgery.  Attempted EGD on 11/21/2018 but procedure was incomplete due to large amount of retained gastric contents in the stomach.  The examined esophagus was normal.  Patient had not eaten 13 hours prior to procedure.  Gastric emptying study on 12/03/2018 normal.  Although GES normal, I still query whether patient may have component of gastroparesis with her current symptoms in the setting of diabetes.  Currently on Ozempic which also delayed gastric emptying.  We need to reattempt EGD for complete evaluation.  Biopsies can also be taken at the time of EGD for evaluation of H. pylori eradication.  Proceed with EGD with propofol with Dr. Gala Romney in the near future. The risks, benefits, and alternatives have been discussed in detail with patient. They have stated understanding and desire to proceed.  Patient will be on clears liquids the entire day prior to her EGD to ensure her stomach is empty.  Due to being on a clear liquid diet, will adjust her diabetes medications.  See separate instructions. We will try switching Protonix to Nexium 40 mg twice daily to see if this helps control her reflux symptoms any better. Discussed gastroparesis diet including low-fat and low fiber diet and eating 4-6 small meals a day.    Gastroparesis diet handout provided. Refilled Phenergan prescription as this works better than Zofran.  She uses this very sparingly.  Counseled on potential for drowsiness and dizziness. Follow-up after procedure.

## 2018-12-12 NOTE — Telephone Encounter (Signed)
Patient states the nexium is going to cost $152 with insurance. Requesting alternative.

## 2018-12-16 ENCOUNTER — Encounter (HOSPITAL_COMMUNITY): Payer: Self-pay

## 2018-12-16 NOTE — Telephone Encounter (Signed)
Tried contacting pts insurance company and was placed on hold for 20 mins. Will call back [phone].

## 2018-12-17 ENCOUNTER — Other Ambulatory Visit (HOSPITAL_COMMUNITY)
Admission: RE | Admit: 2018-12-17 | Discharge: 2018-12-17 | Disposition: A | Discharge status: Home / Self Care | Payer: BC Managed Care – PPO | Source: Ambulatory Visit | Attending: Internal Medicine | Admitting: Internal Medicine

## 2018-12-17 ENCOUNTER — Other Ambulatory Visit: Payer: Self-pay

## 2018-12-17 ENCOUNTER — Encounter (HOSPITAL_COMMUNITY)
Admission: RE | Admit: 2018-12-17 | Discharge: 2018-12-17 | Disposition: A | Discharge status: Home / Self Care | Payer: BC Managed Care – PPO | Source: Ambulatory Visit | Attending: Internal Medicine | Admitting: Internal Medicine

## 2018-12-17 DIAGNOSIS — Z01812 Encounter for preprocedural laboratory examination: Secondary | ICD-10-CM | POA: Diagnosis not present

## 2018-12-17 DIAGNOSIS — Z20828 Contact with and (suspected) exposure to other viral communicable diseases: Secondary | ICD-10-CM | POA: Diagnosis not present

## 2018-12-17 LAB — SARS CORONAVIRUS 2 (TAT 6-24 HRS): SARS Coronavirus 2: NEGATIVE

## 2018-12-19 ENCOUNTER — Ambulatory Visit (HOSPITAL_COMMUNITY): Payer: BC Managed Care – PPO | Admitting: Anesthesiology

## 2018-12-19 ENCOUNTER — Other Ambulatory Visit: Payer: Self-pay

## 2018-12-19 ENCOUNTER — Encounter (HOSPITAL_COMMUNITY)
Admission: RE | Disposition: A | Discharge status: Home / Self Care | Payer: Self-pay | Source: Home / Self Care | Attending: Internal Medicine

## 2018-12-19 ENCOUNTER — Encounter (HOSPITAL_COMMUNITY): Payer: Self-pay | Admitting: Internal Medicine

## 2018-12-19 ENCOUNTER — Ambulatory Visit (HOSPITAL_COMMUNITY)
Admission: RE | Admit: 2018-12-19 | Discharge: 2018-12-19 | Disposition: A | Discharge status: Home / Self Care | Payer: BC Managed Care – PPO | Attending: Internal Medicine | Admitting: Internal Medicine

## 2018-12-19 DIAGNOSIS — G4730 Sleep apnea, unspecified: Secondary | ICD-10-CM | POA: Diagnosis not present

## 2018-12-19 DIAGNOSIS — Z87442 Personal history of urinary calculi: Secondary | ICD-10-CM | POA: Insufficient documentation

## 2018-12-19 DIAGNOSIS — F329 Major depressive disorder, single episode, unspecified: Secondary | ICD-10-CM | POA: Insufficient documentation

## 2018-12-19 DIAGNOSIS — G43909 Migraine, unspecified, not intractable, without status migrainosus: Secondary | ICD-10-CM | POA: Diagnosis not present

## 2018-12-19 DIAGNOSIS — E119 Type 2 diabetes mellitus without complications: Secondary | ICD-10-CM | POA: Insufficient documentation

## 2018-12-19 DIAGNOSIS — M1990 Unspecified osteoarthritis, unspecified site: Secondary | ICD-10-CM | POA: Insufficient documentation

## 2018-12-19 DIAGNOSIS — K219 Gastro-esophageal reflux disease without esophagitis: Secondary | ICD-10-CM | POA: Diagnosis not present

## 2018-12-19 DIAGNOSIS — Z791 Long term (current) use of non-steroidal anti-inflammatories (NSAID): Secondary | ICD-10-CM | POA: Diagnosis not present

## 2018-12-19 DIAGNOSIS — Z7984 Long term (current) use of oral hypoglycemic drugs: Secondary | ICD-10-CM | POA: Insufficient documentation

## 2018-12-19 DIAGNOSIS — R112 Nausea with vomiting, unspecified: Secondary | ICD-10-CM | POA: Diagnosis not present

## 2018-12-19 DIAGNOSIS — K279 Peptic ulcer, site unspecified, unspecified as acute or chronic, without hemorrhage or perforation: Secondary | ICD-10-CM | POA: Diagnosis not present

## 2018-12-19 DIAGNOSIS — F419 Anxiety disorder, unspecified: Secondary | ICD-10-CM | POA: Insufficient documentation

## 2018-12-19 DIAGNOSIS — R1013 Epigastric pain: Secondary | ICD-10-CM | POA: Insufficient documentation

## 2018-12-19 DIAGNOSIS — Z881 Allergy status to other antibiotic agents status: Secondary | ICD-10-CM | POA: Insufficient documentation

## 2018-12-19 DIAGNOSIS — Z888 Allergy status to other drugs, medicaments and biological substances status: Secondary | ICD-10-CM | POA: Diagnosis not present

## 2018-12-19 DIAGNOSIS — I10 Essential (primary) hypertension: Secondary | ICD-10-CM | POA: Insufficient documentation

## 2018-12-19 DIAGNOSIS — Z9049 Acquired absence of other specified parts of digestive tract: Secondary | ICD-10-CM | POA: Insufficient documentation

## 2018-12-19 DIAGNOSIS — Z8619 Personal history of other infectious and parasitic diseases: Secondary | ICD-10-CM

## 2018-12-19 HISTORY — PX: ESOPHAGOGASTRODUODENOSCOPY (EGD) WITH PROPOFOL: SHX5813

## 2018-12-19 LAB — GLUCOSE, CAPILLARY: Glucose-Capillary: 175 mg/dL — ABNORMAL HIGH (ref 70–99)

## 2018-12-19 SURGERY — ESOPHAGOGASTRODUODENOSCOPY (EGD) WITH PROPOFOL
Anesthesia: General

## 2018-12-19 MED ORDER — KETAMINE HCL 50 MG/5ML IJ SOSY
PREFILLED_SYRINGE | INTRAMUSCULAR | Status: AC
  Filled 2018-12-19: qty 5

## 2018-12-19 MED ORDER — PROPOFOL 500 MG/50ML IV EMUL
INTRAVENOUS | Status: DC | PRN
  Administered 2018-12-19: 150 ug/kg/min via INTRAVENOUS

## 2018-12-19 MED ORDER — CHLORHEXIDINE GLUCONATE CLOTH 2 % EX PADS: 6.0000 | MEDICATED_PAD | Freq: Once | CUTANEOUS | Status: DC

## 2018-12-19 MED ORDER — PROPOFOL 10 MG/ML IV BOLUS
INTRAVENOUS | Status: DC | PRN
  Administered 2018-12-19: 50 mg via INTRAVENOUS

## 2018-12-19 MED ORDER — LACTATED RINGERS IV SOLN
Freq: Once | INTRAVENOUS | Status: AC
  Administered 2018-12-19: 08:00:00 via INTRAVENOUS

## 2018-12-19 MED ORDER — LACTATED RINGERS IV SOLN
INTRAVENOUS | Status: DC | PRN
  Administered 2018-12-19: 09:00:00 via INTRAVENOUS

## 2018-12-19 NOTE — Anesthesia Preprocedure Evaluation (Signed)
Anesthesia Evaluation  Patient identified by MRN, date of birth, ID band Patient awake    Reviewed: Allergy & Precautions, NPO status , Patient's Chart, lab work & pertinent test results  Airway Mallampati: II  TM Distance: >3 FB Neck ROM: Full    Dental no notable dental hx.    Pulmonary sleep apnea ,    Pulmonary exam normal breath sounds clear to auscultation       Cardiovascular hypertension, Pt. on medications  Rhythm:Regular Rate:Normal     Neuro/Psych  Headaches, PSYCHIATRIC DISORDERS Anxiety Depression    GI/Hepatic PUD, GERD  Medicated,  Endo/Other  diabetes, Well Controlled, Type 2  Renal/GU Renal disease     Musculoskeletal  (+) Arthritis ,   Abdominal   Peds  Hematology   Anesthesia Other Findings   Reproductive/Obstetrics                             Anesthesia Physical Anesthesia Plan  ASA: II  Anesthesia Plan: General   Post-op Pain Management:    Induction: Intravenous  PONV Risk Score and Plan:   Airway Management Planned: Natural Airway, Nasal Cannula and Simple Face Mask  Additional Equipment:   Intra-op Plan:   Post-operative Plan:   Informed Consent: I have reviewed the patients History and Physical, chart, labs and discussed the procedure including the risks, benefits and alternatives for the proposed anesthesia with the patient or authorized representative who has indicated his/her understanding and acceptance.     Dental advisory given  Plan Discussed with: CRNA  Anesthesia Plan Comments: (Possible GA with ETT was discussed )        Anesthesia Quick Evaluation

## 2018-12-19 NOTE — Discharge Instructions (Signed)
EGD Discharge instructions Please read the instructions outlined below and refer to this sheet in the next few weeks. These discharge instructions provide you with general information on caring for yourself after you leave the hospital. Your doctor may also give you specific instructions. While your treatment has been planned according to the most current medical practices available, unavoidable complications occasionally occur. If you have any problems or questions after discharge, please call your doctor. ACTIVITY  You may resume your regular activity but move at a slower pace for the next 24 hours.   Take frequent rest periods for the next 24 hours.   Walking will help expel (get rid of) the air and reduce the bloated feeling in your abdomen.   No driving for 24 hours (because of the anesthesia (medicine) used during the test).   You may shower.   Do not sign any important legal documents or operate any machinery for 24 hours (because of the anesthesia used during the test).  NUTRITION  Drink plenty of fluids.   You may resume your normal diet.   Begin with a light meal and progress to your normal diet.   Avoid alcoholic beverages for 24 hours or as instructed by your caregiver.  MEDICATIONS  You may resume your normal medications unless your caregiver tells you otherwise.  WHAT YOU CAN EXPECT TODAY  You may experience abdominal discomfort such as a feeling of fullness or gas pains.  FOLLOW-UP  Your doctor will discuss the results of your test with you.  SEEK IMMEDIATE MEDICAL ATTENTION IF ANY OF THE FOLLOWING OCCUR:  Excessive nausea (feeling sick to your stomach) and/or vomiting.   Severe abdominal pain and distention (swelling).   Trouble swallowing.   Temperature over 101 F (37.8 C).   Rectal bleeding or vomiting of blood.    GERD information provided  Please begin Nexium 40 mg twice daily as previously prescribed  Further recommendations to follow  pending review of pathology report  Office visit with us in 8 weeks  At patient request, I called Revonda Standardllison at [phone] and discussed results.    Gastroesophageal Reflux Disease, Adult Gastroesophageal reflux (GER) happens when acid from the stomach flows up into the tube that connects the mouth and the stomach (esophagus). Normally, food travels down the esophagus and stays in the stomach to be digested. With GER, food and stomach acid sometimes move back up into the esophagus. You may have a disease called gastroesophageal reflux disease (GERD) if the reflux:  Happens often.  Causes frequent or very bad symptoms.  Causes problems such as damage to the esophagus. When this happens, the esophagus becomes sore and swollen (inflamed). Over time, GERD can make small holes (ulcers) in the lining of the esophagus. What are the causes? This condition is caused by a problem with the muscle between the esophagus and the stomach. When this muscle is weak or not normal, it does not close properly to keep food and acid from coming back up from the stomach. The muscle can be weak because of:  Tobacco use.  Pregnancy.  Having a certain type of hernia (hiatal hernia).  Alcohol use.  Certain foods and drinks, such as coffee, chocolate, onions, and peppermint. What increases the risk? You are more likely to develop this condition if you:  Are overweight.  Have a disease that affects your connective tissue.  Use NSAID medicines. What are the signs or symptoms? Symptoms of this condition include:  Heartburn.  Difficult or painful swallowing.  The feeling of having a lump in the throat.  A bitter taste in the mouth.  Bad breath.  Having a lot of saliva.  Having an upset or bloated stomach.  Belching.  Chest pain. Different conditions can cause chest pain. Make sure you see your doctor if you have chest pain.  Shortness of breath or noisy breathing (wheezing).  Ongoing  (chronic) cough or a cough at night.  Wearing away of the surface of teeth (tooth enamel).  Weight loss. How is this treated? Treatment will depend on how bad your symptoms are. Your doctor may suggest:  Changes to your diet.  Medicine.  Surgery. Follow these instructions at home: Eating and drinking   Follow a diet as told by your doctor. You may need to avoid foods and drinks such as: ? Coffee and tea (with or without caffeine). ? Drinks that contain alcohol. ? Energy drinks and sports drinks. ? Bubbly (carbonated) drinks or sodas. ? Chocolate and cocoa. ? Peppermint and mint flavorings. ? Garlic and onions. ? Horseradish. ? Spicy and acidic foods. These include peppers, chili powder, curry powder, vinegar, hot sauces, and BBQ sauce. ? Citrus fruit juices and citrus fruits, such as oranges, lemons, and limes. ? Tomato-based foods. These include red sauce, chili, salsa, and pizza with red sauce. ? Fried and fatty foods. These include donuts, french fries, potato chips, and high-fat dressings. ? High-fat meats. These include hot dogs, rib eye steak, sausage, ham, and bacon. ? High-fat dairy items, such as whole milk, butter, and cream cheese.  Eat small meals often. Avoid eating large meals.  Avoid drinking large amounts of liquid with your meals.  Avoid eating meals during the 2-3 hours before bedtime.  Avoid lying down right after you eat.  Do not exercise right after you eat. Lifestyle   Do not use any products that contain nicotine or tobacco. These include cigarettes, e-cigarettes, and chewing tobacco. If you need help quitting, ask your doctor.  Try to lower your stress. If you need help doing this, ask your doctor.  If you are overweight, lose an amount of weight that is healthy for you. Ask your doctor about a safe weight loss goal. General instructions  Pay attention to any changes in your symptoms.  Take over-the-counter and prescription medicines only  as told by your doctor. Do not take aspirin, ibuprofen, or other NSAIDs unless your doctor says it is okay.  Wear loose clothes. Do not wear anything tight around your waist.  Raise (elevate) the head of your bed about 6 inches (15 cm).  Avoid bending over if this makes your symptoms worse.  Keep all follow-up visits as told by your doctor. This is important. Contact a doctor if:  You have new symptoms.  You lose weight and you do not know why.  You have trouble swallowing or it hurts to swallow.  You have wheezing or a cough that keeps happening.  Your symptoms do not get better with treatment.  You have a hoarse voice. Get help right away if:  You have pain in your arms, neck, jaw, teeth, or back.  You feel sweaty, dizzy, or light-headed.  You have chest pain or shortness of breath.  You throw up (vomit) and your throw-up looks like blood or coffee grounds.  You pass out (faint).  Your poop (stool) is bloody or black.  You cannot swallow, drink, or eat. Summary  If a person has gastroesophageal reflux disease (GERD), food and stomach acid move  back up into the esophagus and cause symptoms or problems such as damage to the esophagus.  Treatment will depend on how bad your symptoms are.  Follow a diet as told by your doctor.  Take all medicines only as told by your doctor. This information is not intended to replace advice given to you by your health care provider. Make sure you discuss any questions you have with your health care provider. Document Released: 06/14/2007 Document Revised: 07/04/2017 Document Reviewed: 07/04/2017 Elsevier Patient Education  2020 [employer] These instructions provide you with information about caring for yourself after your procedure. Your health care provider may also give you more specific instructions. Your treatment has been planned according to current medical practices, but  problems sometimes occur. Call your health care provider if you have any problems or questions after your procedure. What can I expect after the procedure? After your procedure, you may:  Feel sleepy for several hours.  Feel clumsy and have poor balance for several hours.  Feel forgetful about what happened after the procedure.  Have poor judgment for several hours.  Feel nauseous or vomit.  Have a sore throat if you had a breathing tube during the procedure. Follow these instructions at home: For at least 24 hours after the procedure:      Have a responsible adult stay with you. It is important to have someone help care for you until you are awake and alert.  Rest as needed.  Do not: ? Participate in activities in which you could fall or become injured. ? Drive. ? Use heavy machinery. ? Drink alcohol. ? Take sleeping pills or medicines that cause drowsiness. ? Make important decisions or sign legal documents. ? Take care of children on your own. Eating and drinking  Follow the diet that is recommended by your health care provider.  If you vomit, drink water, juice, or soup when you can drink without vomiting.  Make sure you have little or no nausea before eating solid foods. General instructions  Take over-the-counter and prescription medicines only as told by your health care provider.  If you have sleep apnea, surgery and certain medicines can increase your risk for breathing problems. Follow instructions from your health care provider about wearing your sleep device: ? Anytime you are sleeping, including during daytime naps. ? While taking prescription pain medicines, sleeping medicines, or medicines that make you drowsy.  If you smoke, do not smoke without supervision.  Keep all follow-up visits as told by your health care provider. This is important. Contact a health care provider if:  You keep feeling nauseous or you keep vomiting.  You feel  light-headed.  You develop a rash.  You have a fever. Get help right away if:  You have trouble breathing. Summary  For several hours after your procedure, you may feel sleepy and have poor judgment.  Have a responsible adult stay with you for at least 24 hours or until you are awake and alert. This information is not intended to replace advice given to you by your health care provider. Make sure you discuss any questions you have with your health care provider. Document Released: 04/18/2015 Document Revised: 03/26/2017 Document Reviewed: 04/18/2015 Elsevier Patient Education  2020 [employer].

## 2018-12-19 NOTE — H&P (Signed)
@LOGO @   Primary Care Physician:  Celene Squibb, MD Primary Gastroenterologist:  Dr. Gala Romney  Pre-Procedure History & Physical: HPI:  Ashley Dickson is a 58 y.o. female here for follow-up EGD.  Prior EGD incomplete due to retained food.  Clear liquids yesterday.  No dysphagia.  Hopefully, she will have an empty upper GI tract today.  Notably, since her last attempt at EGD, nuclear gastric emptying study came back normal.  We switched her from Protonix to Nexium all just last week.  She has not filled that medication as of yet.  Past Medical History:  Diagnosis Date  . Anxiety   . Arthritis   . Diabetes mellitus without complication ([place])   . GERD (gastroesophageal reflux disease)   . H pylori ulcer   . History of kidney stones   . Migraines   . Renal disorder   . Shingles   . Sleep apnea    not using CPAP.    Past Surgical History:  Procedure Laterality Date  . CHOLECYSTECTOMY    . CHONDROPLASTY Left 10/31/2018   Procedure: CHONDROPLASTY MEDIAL FEMORAL CONDYLE AND PATELLA;  Surgeon: Carole Civil, MD;  Location: [facility];  Service: Orthopedics;  Laterality: Left;  . CYSTOSCOPY     with stone extraction-multiple  . ESOPHAGOGASTRODUODENOSCOPY (EGD) WITH PROPOFOL N/A 11/21/2018   Procedure: ESOPHAGOGASTRODUODENOSCOPY (EGD) WITH PROPOFOL;  Surgeon: Daneil Dolin, MD;  Location: [facility];  Service: Endoscopy;  Laterality: N/A;  10:45am  . excision of bone spur Bilateral    heels  . KNEE ARTHROSCOPY WITH LATERAL MENISECTOMY Left 10/31/2018   Procedure: LEFT KNEE ARTHROSCOPY WITH MEDIAL MENISCECTOMY;  Surgeon: Carole Civil, MD;  Location: [facility];  Service: Orthopedics;  Laterality: Left;  . TUBAL LIGATION      Prior to Admission medications   Medication Sig Start Date End Date Taking? Authorizing Provider  atorvastatin (LIPITOR) 40 MG tablet Take 40 mg by mouth daily.  01/21/15 12/12/18 Yes [provider]  buPROPion (WELLBUTRIN XL) 300 MG 24 hr tablet Take  300 mg by mouth daily.   Yes [provider]  chlorthalidone (HYGROTON) 25 MG tablet Take 25 mg by mouth daily.   Yes [provider]  escitalopram (LEXAPRO) 10 MG tablet Take 10 mg by mouth daily.    Yes [provider]  esomeprazole (NEXIUM) 40 MG capsule Take 1 capsule (40 mg total) by mouth 2 (two) times daily before a meal. 12/12/18  Yes Jodi Mourning, Kristen S, PA-C  glipiZIDE (GLUCOTROL) 10 MG tablet Take 10 mg by mouth daily. 09/17/18  Yes [provider]  irbesartan (AVAPRO) 75 MG tablet Take 75 mg by mouth daily.   Yes [provider]  metFORMIN (GLUMETZA) 1000 MG (MOD) 24 hr tablet Take 1,000 mg by mouth 2 (two) times daily with a meal.   Yes [provider]  promethazine (PHENERGAN) 12.5 MG tablet Take 1 tablet (12.5 mg total) by mouth every 6 (six) hours as needed for nausea or vomiting. 12/12/18  Yes Erenest Rasher, PA-C  Semaglutide,0.25 or 0.5MG /DOS, (OZEMPIC, 0.25 OR 0.5 MG/DOSE,) 2 MG/1.5ML SOPN Inject 0.5 mg into the skin every Thursday.    Yes [provider]    Allergies as of 12/12/2018 - Review Complete 12/12/2018  Allergen Reaction Noted  . Azithromycin Anaphylaxis 02/16/2015  . Tramadol Itching 02/16/2015  . Ace inhibitors Cough 02/24/2015  . Sumatriptan Other (See Comments) 02/24/2015  . Voltaren [diclofenac] Swelling 10/16/2018    Family History  Problem  Relation Age of Onset  . Colon cancer Neg Hx   . Colon polyps Neg Hx     Social History   Socioeconomic History  . Marital status: Single    Spouse name: Not on file  . Number of children: Not on file  . Years of education: Not on file  . Highest education level: Not on file  Occupational History  . Not on file  Tobacco Use  . Smoking status: Never Smoker  . Smokeless tobacco: Never Used  Substance and Sexual Activity  . Alcohol use: Yes    Comment: rarely  . Drug use: Not Currently    Types: Cocaine    Comment: Last used 20 years ago.   Marland Kitchen  Sexual activity: Yes  Other Topics Concern  . Not on file  Social History Narrative  . Not on file   Social Determinants of Health   Financial Resource Strain:   . Difficulty of Paying Living Expenses: Not on file  Food Insecurity:   . Worried About Programme researcher, broadcasting/film/video in the Last Year: Not on file  . Ran Out of Food in the Last Year: Not on file  Transportation Needs:   . Lack of Transportation (Medical): Not on file  . Lack of Transportation (Non-Medical): Not on file  Physical Activity:   . Days of Exercise per Week: Not on file  . Minutes of Exercise per Session: Not on file  Stress:   . Feeling of Stress : Not on file  Social Connections:   . Frequency of Communication with Friends and Family: Not on file  . Frequency of Social Gatherings with Friends and Family: Not on file  . Attends Religious Services: Not on file  . Active Member of Clubs or Organizations: Not on file  . Attends Banker Meetings: Not on file  . Marital Status: Not on file  Intimate Partner Violence:   . Fear of Current or Ex-Partner: Not on file  . Emotionally Abused: Not on file  . Physically Abused: Not on file  . Sexually Abused: Not on file    Review of Systems: See HPI, otherwise negative ROS  Physical Exam: BP (!) 116/91   Pulse 89   Temp 98.4 F (36.9 C) (Oral)   Resp 18   SpO2 97%  General:   Alert,  Well-developed, well-nourished, pleasant and cooperative in NAD Neck:  Supple; no masses or thyromegaly. No significant cervical adenopathy. Lungs:  Clear throughout to auscultation.   No wheezes, crackles, or rhonchi. No acute distress. Heart:  Regular rate and rhythm; no murmurs, clicks, rubs,  or gallops. Abdomen: Non-distended, normal bowel sounds.  Soft and nontender without appreciable mass or hepatosplenomegaly.  Pulses:  Normal pulses noted. Extremities:  Without clubbing or edema.  Impression/Plan: 58 year old lady here for further evaluation of nausea upper GI  tract symptoms/dyspepsia.  Hopefully, she will have an empty upper GI tract to evaluate today. The risks, benefits, limitations, alternatives and imponderables have been reviewed with the patient. Potential for esophageal dilation, biopsy, etc. have also been reviewed.  Questions have been answered. All parties agreeable.     Notice: This dictation was prepared with Dragon dictation along with smaller phrase technology. Any transcriptional errors that result from this process are unintentional and may not be corrected upon review.

## 2018-12-19 NOTE — Transfer of Care (Signed)
Immediate Anesthesia Transfer of Care Note  Patient: Ashley Dickson  Procedure(s) Performed: ESOPHAGOGASTRODUODENOSCOPY (EGD) WITH PROPOFOL (N/A )  Patient Location: PACU  Anesthesia Type:MAC  Level of Consciousness: awake, alert , oriented and patient cooperative  Airway & Oxygen Therapy: Patient Spontanous Breathing and Patient connected to nasal cannula oxygen  Post-op Assessment: Report given to RN, Post -op Vital signs reviewed and stable and Patient moving all extremities X 4  Post vital signs: Reviewed and stable  Last Vitals:  Vitals Value Taken Time  BP 115/82 12/19/18 0907  Temp    Pulse 103 12/19/18 0908  Resp 12 12/19/18 0908  SpO2 99 % 12/19/18 0908  Vitals shown include unvalidated device data.  Last Pain:  Vitals:   12/19/18 0852  TempSrc:   PainSc: 0-No pain      Patients Stated Pain Goal: 8 (08/23/46 1856)  Complications: No apparent anesthesia complications

## 2018-12-19 NOTE — Op Note (Signed)
[facility] Patient Name: Ashley BarrSheryl Dickson Procedure Date: 12/19/2018 8:29 AM MRN: [id number] Date of Birth: 07/19/1960 Attending MD: Gennette Pacobert Michael Tarahji Ramthun , MD CSN: [id number] Age: [id number] Admit Type: Outpatient Procedure:                Upper GI endoscopy Indications:              Dyspepsia Providers:                Gennette Pacobert Michael Grenda Lora, MD, Criselda PeachesLurae B. Patsy LagerAlbert RN, RN,                            Edythe ClarityKelly Cox, Technician Referring MD:              Medicines:                Propofol per Anesthesia Complications:            No immediate complications. Estimated Blood Loss:     Estimated blood loss was minimal. Procedure:                Pre-Anesthesia Assessment:                           - Prior to the procedure, a History and Physical                            was performed, and patient medications and                            allergies were reviewed. The patient's tolerance of                            previous anesthesia was also reviewed. The risks                            and benefits of the procedure and the sedation                            options and risks were discussed with the patient.                            All questions were answered, and informed consent                            was obtained. Prior Anticoagulants: The patient has                            taken no previous anticoagulant or antiplatelet                            agents. ASA Grade Assessment: II - A patient with                            mild systemic disease. After reviewing the risks  and benefits, the patient was deemed in                            satisfactory condition to undergo the procedure.                           After obtaining informed consent, the endoscope was                            passed under direct vision. Throughout the                            procedure, the patient's blood pressure, pulse, and                            oxygen saturations were  monitored continuously. The                            [id number]-[id number] ([id number]) scope was introduced through the                            mouth, and advanced to the second part of duodenum.                            The upper GI endoscopy was accomplished without                            difficulty. The patient tolerated the procedure                            well. Scope In: 8:56:23 AM Scope Out: 8:59:59 AM Total Procedure Duration: 0 hours 3 minutes 36 seconds  Findings:      The examined esophagus was normal. [place] completely empty.      The entire examined stomach was normal. This was biopsied with a cold       forceps for histology. Estimated blood loss was minimal.      The duodenal bulb and second portion of the duodenum were normal. Impression:               - Normal esophagus.                           - Normal stomach. Biopsied.                           - Normal duodenal bulb and second portion of the                            duodenum. Moderate Sedation:      Moderate (conscious) sedation was personally administered by an       anesthesia professional. The following parameters were monitored: oxygen       saturation, heart rate, blood pressure, respiratory rate, EKG, adequacy       of pulmonary ventilation, and response to care. Recommendation:           - Patient has  a contact number available for                            emergencies. The signs and symptoms of potential                            delayed complications were discussed with the                            patient. Return to normal activities tomorrow.                            Written discharge instructions were provided to the                            patient.                           - Advance diet as tolerated.                           - Continue present medications. Begin Nexium 40 mg                            twice daily as previously prescribed. We will                            follow-up on  pathology to document eradication of                            H. pylori.                           - Return to my office in 8 weeks. Procedure Code(s):        --- Professional ---                           [phone], Esophagogastroduodenoscopy, flexible,                            transoral; with biopsy, single or multiple Diagnosis Code(s):        --- Professional ---                           R10.13, Epigastric pain CPT copyright 2019 American Medical Association. All rights reserved. The codes documented in this report are preliminary and upon coder review may  be revised to meet current compliance requirements. Cristopher Estimable. Jodel Mayhall, MD Norvel Richards, MD 12/19/2018 9:08:35 AM This report has been signed electronically. Number of Addenda: 0

## 2018-12-19 NOTE — Anesthesia Postprocedure Evaluation (Signed)
Anesthesia Post Note  Patient: Ashley Dickson  Procedure(s) Performed: ESOPHAGOGASTRODUODENOSCOPY (EGD) WITH PROPOFOL (N/A )  Patient location during evaluation: PACU Anesthesia Type: MAC Level of consciousness: awake and alert, sedated and awake Pain management: satisfactory to patient Vital Signs Assessment: post-procedure vital signs reviewed and stable Respiratory status: spontaneous breathing, respiratory function stable and nonlabored ventilation Cardiovascular status: stable Postop Assessment: no apparent nausea or vomiting Anesthetic complications: no     Last Vitals:  Vitals:   12/19/18 0731  BP: (!) 116/91  Pulse: 89  Resp: 18  Temp: 36.9 C  SpO2: 97%    Last Pain:  Vitals:   12/19/18 0852  TempSrc:   PainSc: 0-No pain                 Danalee Flath

## 2018-12-20 ENCOUNTER — Other Ambulatory Visit: Payer: Self-pay

## 2018-12-20 ENCOUNTER — Other Ambulatory Visit (HOSPITAL_COMMUNITY): Payer: BC Managed Care – PPO

## 2018-12-20 LAB — SURGICAL PATHOLOGY

## 2018-12-21 ENCOUNTER — Encounter: Payer: Self-pay | Admitting: Internal Medicine

## 2018-12-29 NOTE — Telephone Encounter (Signed)
Ashley Dickson, can we follow-up on this again? I can't tell from chart review when patient was last on omeprazole. If it has been a couple of years, we could retry this as it was well covered (if she is still having daily reflux symptoms on the protonix BID).

## 2018-12-30 NOTE — Telephone Encounter (Signed)
Noted. If generic Nexium is affordable for her to take twice daily and it controls her GERD symptoms well, she can continue on this. Or, I can try sending in Omeprazole to see if this works better than the Protonix did.

## 2018-12-30 NOTE — Telephone Encounter (Signed)
Called pt and informed her of KH's recommendations.  Pt said that she would like to continue taking the Nexium (generic) for now.  She said it only ended up costing her $24.00.  Pt said that she will call us in the future if she needs [place] to change it.

## 2018-12-30 NOTE — Telephone Encounter (Signed)
Noted  

## 2018-12-30 NOTE — Telephone Encounter (Signed)
Called pt and she said that she has not taken omeprazole at all.  She said that she stopped her Protonix on Sat morning.  She was on it for 4 to 5 months.  She started on Nexium (generic) Saturday evening for the first time.

## 2019-01-09 ENCOUNTER — Other Ambulatory Visit: Payer: Self-pay | Admitting: Gastroenterology

## 2019-01-09 DIAGNOSIS — K219 Gastro-esophageal reflux disease without esophagitis: Secondary | ICD-10-CM

## 2019-01-09 DIAGNOSIS — R112 Nausea with vomiting, unspecified: Secondary | ICD-10-CM

## 2019-02-19 NOTE — Progress Notes (Signed)
Referring Provider: Benita Stabile, MD Primary Care Physician:  Benita Stabile, MD Primary GI Physician: Dr. Jena Gauss  Chief Complaint  Patient presents with   Gastroesophageal Reflux    constipation,hernia, needs refill on Phenergan    HPI:   Ashley Dickson is a 59 y.o. female presenting today for follow-up of GERD and constipation/intermittent diarrhea. History of GERD, H. pylori s/p treatment in September 2020, and constipation.  EGD attempted on 11/21/2018 for uncontrolled GERD, intermittent nausea and occasional vomiting.  This was incomplete due to large amount of retained gastric contents.  Follow-up gastric emptying study on 12/03/2018 was normal.   She was last seen in our office on 12/12/2018 to discuss rescheduling her EGD for uncontrolled GERD.  She was on Protonix 40 mg twice daily but continue to have reflux symptoms daily along with belching. Egg taste had improved some.  She had started Ozempic and lost 30 pounds.  Also limited fried foods, no longer eating out, limited sweets, and soda.  Continued with daily nausea and intermittent vomiting.  Associated early satiety.  After starting a fiber supplement, she noted initial improvement in daily stools but then stools became more mushy/loose.  Noted occasional watery stools with urgency after eating eggs.  No frequent diarrhea.  No prior colonoscopy but she did not feel she could tolerate the prep with ongoing nausea.  Although GES was normal, it was suspected patient may have gastroparesis considering her current symptoms in the setting of diabetes.  Also recently started on Ozempic which can delay gastric emptying.  We discussed gastroparesis diet handout was provided.  Plans to proceed with EGD for complete evaluation.  Would also need biopsies to evaluate H. pylori eradication.  Protonix switched to Nexium 40 mg twice daily. For alternating constipation and diarrhea, she was advised to use MiraLAX 1 capful daily as needed. Titrate to 1  BM daily to every other day. Avoid dietary triggers.   EGD completed on 12/19/2018 with normal esophagus, normal stomach s/p biopsy, normal duodenal bulb and second portion of the duodenum.  Pathology with benign gastric mucosa.  No H. Pylori.  She was advised to start Nexium 40 mg twice daily.  Today:  Had to have 8 teeth pulled yesterday. States the acid was causing deterioration.    GERD: Tried Nexium. This worsened GERD worsened. Resumed Protonix which seems to be working [employer]. GERD symptoms about 3 times a week.  Worse in the morning.  Feels like burning in her chest and burping. Has taken Prilosec as needed in the past. Never taken this regularly. No dysphagia.   Nausea/Vomiting/Early Satiety: Went back to work in December. This has helped with her nausea/vomiting. Vomited 10 times in 3 months which is much improved. Last episode about 1 week ago. This was after eating hash browns at work. Has cut back on what she eats and when she eats. Cut out steak and hamburger. Cut back on bread. No fast food. Limiting soda to 1 a week. Eating 2 meals a day. Last meal around 5-6 pm. Goes to bed around 9 pm. Continues with early satiety. Hasn't tried eating 4-6 small meals. Difficult to do this with her job. She is a Armed forces technical officer. Nausea is also improving. Only 2-3 times a week where it was daily. When nausea or vomiting occurs, it is after eating. Associated with something that is greasy or fatty or if she eats past feeling full. No longer having any egg tasting burps since being  on Protonix BID.  After vomiting, she feels totally fine. No hematemesis.    Constipation: Having 2-3 BMs a week. Has increased her leafy greens. Eating a salad every 2 days. BMs are typically soft and formed. Tried MiraLAX and it made her gag when she drank it in water. Took MiraLAX for 2 weeks. When taking it, she had more regular BMs. Has had a couple of accidents with urgent BMs since I saw her last secondary to sudden  urgency to have a BM and inability to get to the bathroom in time. These stools are watery. No known dietary triggers as it happens so randomly. No blood in the stool. No black stool. No abdominal pain.    Hernia:  Patient asking about the hernia above her umbilicus. Wondering if this would be contributing to any of her symptoms. Causes no pain. CT in July 2020 does note fat containing supraumbilical hernia measuring 2.1 cm and a small fat containing umbilical hernia. Patient is requesting referral to general surgery.   Lost about 9 lbs in the last 2.5 months.   Needs first ever colonoscopy. She is ok with scheduling.   Past Medical History:  Diagnosis Date   Anxiety    Arthritis    Diabetes mellitus without complication ([facility])    GERD (gastroesophageal reflux disease)    H pylori ulcer    History of kidney stones    Migraines    Renal disorder    Shingles    Sleep apnea    not using CPAP.    Past Surgical History:  Procedure Laterality Date   CHOLECYSTECTOMY     CHONDROPLASTY Left 10/31/2018   Procedure: CHONDROPLASTY MEDIAL FEMORAL CONDYLE AND PATELLA;  Surgeon: Carole Civil, MD;  Location: [facility];  Service: Orthopedics;  Laterality: Left;   CYSTOSCOPY     with stone extraction-multiple   ESOPHAGOGASTRODUODENOSCOPY (EGD) WITH PROPOFOL N/A 11/21/2018   Procedure: ESOPHAGOGASTRODUODENOSCOPY (EGD) WITH PROPOFOL;  Surgeon: Daneil Dolin, MD;  Incomplete due to large amount of retained gastric contents.    ESOPHAGOGASTRODUODENOSCOPY (EGD) WITH PROPOFOL N/A 12/19/2018   Procedure: ESOPHAGOGASTRODUODENOSCOPY (EGD) WITH PROPOFOL;  Surgeon: Daneil Dolin, MD; normal esophagus, normal stomach s/p biopsy, normal duodenal bulb and second portion of the duodenum.  Pathology with benign gastric mucosa.  No H. Pylori.     excision of bone spur Bilateral    heels   KNEE ARTHROSCOPY WITH LATERAL MENISECTOMY Left 10/31/2018   Procedure: LEFT KNEE ARTHROSCOPY WITH  MEDIAL MENISCECTOMY;  Surgeon: Carole Civil, MD;  Location: [facility];  Service: Orthopedics;  Laterality: Left;   TUBAL LIGATION      Current Outpatient Medications  Medication Sig Dispense Refill   atorvastatin (LIPITOR) 40 MG tablet Take 40 mg by mouth daily.      buPROPion (WELLBUTRIN XL) 300 MG 24 hr tablet Take 300 mg by mouth daily.     chlorthalidone (HYGROTON) 25 MG tablet Take 25 mg by mouth daily.     escitalopram (LEXAPRO) 10 MG tablet Take 10 mg by mouth daily.      glipiZIDE (GLUCOTROL) 10 MG tablet Take 10 mg by mouth daily.     irbesartan (AVAPRO) 75 MG tablet Take 75 mg by mouth daily.     metFORMIN (GLUMETZA) 1000 MG (MOD) 24 hr tablet Take 1,000 mg by mouth 2 (two) times daily with a meal.     promethazine (PHENERGAN) 12.5 MG tablet TAKE 1 TABLET BY MOUTH EVERY 6 HOURS AS NEEDED FOR NAUSEA AND  VOMITING 30 tablet 1   Semaglutide,0.25 or 0.5MG /DOS, (OZEMPIC, 0.25 OR 0.5 MG/DOSE,) 2 MG/1.5ML SOPN Inject 0.5 mg into the skin every Thursday.      omeprazole (PRILOSEC) 40 MG capsule Take 1 capsule (40 mg total) by mouth 2 (two) times daily. 60 capsule 5   polyethylene glycol-electrolytes (TRILYTE) 420 g solution Take 4,000 mLs by mouth as directed. 4000 mL 0   No current facility-administered medications for this visit.    Allergies as of 02/20/2019 - Review Complete 02/20/2019  Allergen Reaction Noted   Azithromycin Anaphylaxis 02/16/2015   Tramadol Itching 02/16/2015   Ace inhibitors Cough 02/24/2015   Sumatriptan Other (See Comments) 02/24/2015   Voltaren [diclofenac] Swelling 10/16/2018    Family History  Problem Relation Age of Onset   Colon cancer Neg Hx    Colon polyps Neg Hx     Social History   Socioeconomic History   Marital status: Single    Spouse name: Not on file   Number of children: Not on file   Years of education: Not on file   Highest education level: Not on file  Occupational History   Not on file  Tobacco Use     Smoking status: Never Smoker   Smokeless tobacco: Never Used  Substance and Sexual Activity   Alcohol use: Yes    Comment: rarely   Drug use: Not Currently    Types: Cocaine    Comment: Last used 20 years ago.    Sexual activity: Yes  Other Topics Concern   Not on file  Social History Narrative   Not on file   Social Determinants of Health   Financial Resource Strain:    Difficulty of Paying Living Expenses: Not on file  Food Insecurity:    Worried About Running Out of Food in the Last Year: Not on file   Ran Out of Food in the Last Year: Not on file  Transportation Needs:    Lack of Transportation (Medical): Not on file   Lack of Transportation (Non-Medical): Not on file  Physical Activity:    Days of Exercise per Week: Not on file   Minutes of Exercise per Session: Not on file  Stress:    Feeling of Stress : Not on file  Social Connections:    Frequency of Communication with Friends and Family: Not on file   Frequency of Social Gatherings with Friends and Family: Not on file   Attends Religious Services: Not on file   Active Member of Clubs or Organizations: Not on file   Attends Banker Meetings: Not on file   Marital Status: Not on file    Review of Systems: Gen: Denies fever, chills, lightheadedness, dizziness, presyncope, or syncope. CV: Denies chest pain or heart palpitations Resp: Denies shortness of breath or cough. GI: See HPI Derm: Denies rash Psych: Denies depression or anxiety.  Medications work well. Heme: Denies bruising or bleeding  Physical Exam: BP 128/88    Pulse 81    Temp (!) 96.8 F (36 C) (Temporal)    Ht 5\' 6"  (1.676 m)    Wt 191 lb (86.6 kg)    BMI 30.83 kg/m  General:   Alert and oriented. No distress noted. Pleasant and cooperative.  Head:  Normocephalic and atraumatic. Eyes:  Conjuctiva clear without scleral icterus. Heart:  S1, S2 present without murmurs appreciated. Lungs:  Clear to auscultation  bilaterally. No wheezes, rales, or rhonchi. No distress.  Abdomen:  +BS, soft, non-tender and non-distended.  No rebound or guarding.  Nontender supraumbilical hernia present.  Msk:  Symmetrical without gross deformities. Normal posture. Extremities:  Without edema. Neurologic:  Alert and  oriented x4 Psych: Normal mood and affect.

## 2019-02-20 ENCOUNTER — Other Ambulatory Visit: Payer: Self-pay

## 2019-02-20 ENCOUNTER — Encounter: Payer: Self-pay | Admitting: Gastroenterology

## 2019-02-20 ENCOUNTER — Ambulatory Visit (INDEPENDENT_AMBULATORY_CARE_PROVIDER_SITE_OTHER): Payer: BC Managed Care – PPO | Admitting: Gastroenterology

## 2019-02-20 VITALS — BP 128/88 | HR 81 | Temp 96.8°F | Ht 66.0 in | Wt 191.0 lb

## 2019-02-20 DIAGNOSIS — R6881 Early satiety: Secondary | ICD-10-CM | POA: Diagnosis not present

## 2019-02-20 DIAGNOSIS — R112 Nausea with vomiting, unspecified: Secondary | ICD-10-CM

## 2019-02-20 DIAGNOSIS — K5900 Constipation, unspecified: Secondary | ICD-10-CM

## 2019-02-20 DIAGNOSIS — K219 Gastro-esophageal reflux disease without esophagitis: Secondary | ICD-10-CM | POA: Diagnosis not present

## 2019-02-20 DIAGNOSIS — K439 Ventral hernia without obstruction or gangrene: Secondary | ICD-10-CM | POA: Insufficient documentation

## 2019-02-20 DIAGNOSIS — Z1211 Encounter for screening for malignant neoplasm of colon: Secondary | ICD-10-CM | POA: Insufficient documentation

## 2019-02-20 MED ORDER — PEG 3350-KCL-NA BICARB-NACL 420 G PO SOLR: 4000.0000 mL | ORAL | 0 refills | Status: AC

## 2019-02-20 MED ORDER — PROMETHAZINE HCL 12.5 MG PO TABS: ORAL_TABLET | ORAL | 1 refills | Status: DC

## 2019-02-20 MED ORDER — OMEPRAZOLE 40 MG PO CPDR: 40.0000 mg | DELAYED_RELEASE_CAPSULE | Freq: Two times a day (BID) | ORAL | 5 refills | Status: DC

## 2019-02-20 NOTE — Patient Instructions (Addendum)
We will get you scheduled for a colonoscopy in the near future with Dr. Jena Gauss. Medication adjustments for procedure: 1 day prior to procedure: 1/2 tablet glipizide (5 mg), 500 mg Metformin twice a day, continue Ozempic. Day of procedure: No diabetes medications the morning of procedure.  Please stop Protonix and start omeprazole 40 mg twice a day 30 minutes before breakfast and dinner. I will send in a prescription.  You should also prop the head of your bed up on risers, wood, or bricks to create a 6 inch incline.  Continue following the gastroparesis diet as much as possible.  Specifically, be sure to follow a low-fat diet and limit fiber in the way of raw vegetables as this will likely worsen your nausea/vomiting/early satiety.  Additionally, limit spicy foods, carbonated beverages, caffeine.  Do not eat within 3 hours of laying down.  Resume MiraLAX 1 capful daily.  You may drink this in something that taste better than water.  Be sure you drink this in 8 ounces of fluid.  We will place referral to general surgery for evaluation of your hernia.  We will plan to follow-up with you in the office in 6 months.  Do not hesitate to call if you have questions or concerns prior.  Ermalinda Memos, PA-C [facility]

## 2019-02-20 NOTE — Assessment & Plan Note (Addendum)
59 year old female with no prior colonoscopy.  Current lower GI symptoms include mild constipation with 2-3 BMs a week.  MiraLAX daily did help with this but she could not tolerate even water.  Additionally, she has had a couple episodes of stool incontinence secondary to urgent watery BMs.  This is rare and without known dietary triggers.  Denies bright red blood per rectum, melena, or abdominal pain.  She has had weight loss which is likely secondary to dietary changes, starting Ozempic in the later part of 2020, and early satiety.  No family history of colon cancer.   Proceed with TCS with propofol with Dr. Jena Gauss in the near future. The risks, benefits, and alternatives have been discussed in detail with patient. They have stated understanding and desire to proceed.  Propofol due to polypharmacy. See separate instructions for diabetes medication adjustments. Resume MiraLAX 1 capful (17 g) daily in 8 ounces of her choice of beverage. Follow-up in 6 months.

## 2019-02-20 NOTE — Assessment & Plan Note (Signed)
CT abdomen and pelvis in July 2020 notes a fat-containing supraumbilical hernia measuring 2.1 cm as well as a small fat-containing umbilical hernia.  No associated pain with the hernia but patient wonders if this may be contributing to any of her upper GI symptoms and desires referral to general surgery.   Referral placed to general surgery.

## 2019-02-20 NOTE — Assessment & Plan Note (Signed)
Addressed under nausea with vomiting 

## 2019-02-20 NOTE — Assessment & Plan Note (Addendum)
Symptoms of daily nausea, vomiting, and uncontrolled GERD started about 5 months ago.  Also with early satiety.  39 pound weight loss since June 2020 which is multifactorial in the setting of starting Ozempic, dietary changes, early satiety.  Initial EGD to evaluate symptoms on 11/21/2018 was incomplete due to large amount of retained gastric contents although patient had not eaten in 13 hours.  Follow-up gastric emptying study was normal.  Repeat EGD on 12/19/2018 entirely normal.  Confirmed H. pylori eradication.  Tried Nexium but this worsened GERD symptoms.  Resume Protonix 40 mg twice daily. Overall, symptoms are improving.  Breakthrough GERD about 3 times a week.  Nausea only 2-3 days a week rather than daily.  Occasional vomiting.  Nausea/vomiting are closely associated with eating greasy or fatty food or eating past the feeling of full. After vomiting, she feels fine. Trying to follow a gastroparesis diet. Limiting fried/fatty foods and soda.  Continues eating 2 meals a day due to work schedule. Denies abdominal pain.  She does have a supraumbilical hernia and wonders if this is playing any role in her symptoms (other hernia causes no pain) and desires referral to general surgery. Of note, she is s/p cholecystectomy.   Although GES was normal in November, patient symptoms seem most consistent with gastroparesis likely secondary to diabetes and influenced by Ozempic as this was started around June or July 2020.  Suspect if we can get her GERD symptoms under better control, this will also help.  Stop Protonix and start omeprazole 40 mg twice daily. Continue following a gastroparesis diet.  Specifically, low-fat diet and limit fiber in the way of raw vegetables. Try eating 4 small meals a day rather than 2 larger meals. Avoid spicy foods, carbonated beverages, caffeine. Prop head of bed up 6 inches. Do not eat within 3 hours of laying down. Refer to general surgery for hernia. Follow-up in 6 months.   She is advised to call if omeprazole does not work well or she has other questions or concerns prior to her follow-up visit.

## 2019-02-20 NOTE — Assessment & Plan Note (Addendum)
GERD symptoms not adequately controlled.  Currently taking Protonix twice daily and having GERD symptoms about 3 times a week.  Typically worse in the morning.  Tried Nexium and this worsened GERD symptoms.  Was on omeprazole in the past but only as needed.  Recent EGD on 12/19/2018 with normal esophagus, normal stomach s/p biopsied, normal duodenal bulb and second portion of the duodenum.  Pathology with benign gastric mucosa.  No H. Pylori.   Stop Protonix and start omeprazole 40 mg twice daily. Advised to follow a gastroparesis diet. (GES normal on 12/03/2018 but EGD prior with large amount of retained gastric contents.  Additionally, with upper GI symptoms including nausea, vomiting, early satiety as discussed above, I am suspicious she likely has gastroparesis) Advised to avoid spicy foods, carbonated beverages, caffeine. Prop the head of her bed up to create a 6 inch incline. Do not eat within 3 hours of laying down. Follow-up in 6 months.  She was advised to call if omeprazole did not work well or if other questions or concerns arise.

## 2019-02-20 NOTE — Assessment & Plan Note (Addendum)
History of chronic constipation.  Currently with BMs 2-3 times a week.  Tried MiraLAX but stated she did not tolerate the taste in water.  While taking MiraLAX, she did have improvement in constipation.  Denies bright red blood per rectum, melena, or abdominal pain.  She has had weight loss which I suspect is secondary to dietary changes, early satiety, and Ozempic.  No prior colonoscopy.  Resume MiraLAX 1 capful (17 g) daily in 8 ounces of her choice beverage. Drink enough water to keep urine pale yellow to clear. Proceed with TCS with propofol with Dr. Jena Gauss in the near future. The risks, benefits, and alternatives have been discussed in detail with patient. They have stated understanding and desire to proceed.  Follow-up in 6 months.

## 2019-02-21 NOTE — Progress Notes (Signed)
Cc'ed to pcp °

## 2019-02-27 ENCOUNTER — Ambulatory Visit: Payer: BC Managed Care – PPO | Admitting: General Surgery

## 2019-03-03 DIAGNOSIS — E1165 Type 2 diabetes mellitus with hyperglycemia: Secondary | ICD-10-CM | POA: Diagnosis not present

## 2019-03-03 DIAGNOSIS — I10 Essential (primary) hypertension: Secondary | ICD-10-CM | POA: Diagnosis not present

## 2019-03-03 DIAGNOSIS — M1990 Unspecified osteoarthritis, unspecified site: Secondary | ICD-10-CM | POA: Diagnosis not present

## 2019-03-03 DIAGNOSIS — Z23 Encounter for immunization: Secondary | ICD-10-CM | POA: Diagnosis not present

## 2019-03-03 DIAGNOSIS — E782 Mixed hyperlipidemia: Secondary | ICD-10-CM | POA: Diagnosis not present

## 2019-03-03 DIAGNOSIS — G4700 Insomnia, unspecified: Secondary | ICD-10-CM | POA: Diagnosis not present

## 2019-03-03 DIAGNOSIS — F331 Major depressive disorder, recurrent, moderate: Secondary | ICD-10-CM | POA: Diagnosis not present

## 2019-03-06 ENCOUNTER — Ambulatory Visit (INDEPENDENT_AMBULATORY_CARE_PROVIDER_SITE_OTHER): Payer: BC Managed Care – PPO | Admitting: General Surgery

## 2019-03-06 ENCOUNTER — Encounter: Payer: Self-pay | Admitting: General Surgery

## 2019-03-06 ENCOUNTER — Other Ambulatory Visit: Payer: Self-pay

## 2019-03-06 VITALS — BP 118/78 | HR 83 | Temp 97.8°F | Resp 14 | Ht 66.0 in | Wt 191.2 lb

## 2019-03-06 DIAGNOSIS — I10 Essential (primary) hypertension: Secondary | ICD-10-CM | POA: Diagnosis not present

## 2019-03-06 DIAGNOSIS — K432 Incisional hernia without obstruction or gangrene: Secondary | ICD-10-CM | POA: Diagnosis not present

## 2019-03-06 DIAGNOSIS — K219 Gastro-esophageal reflux disease without esophagitis: Secondary | ICD-10-CM | POA: Diagnosis not present

## 2019-03-06 DIAGNOSIS — E1165 Type 2 diabetes mellitus with hyperglycemia: Secondary | ICD-10-CM | POA: Diagnosis not present

## 2019-03-06 DIAGNOSIS — E782 Mixed hyperlipidemia: Secondary | ICD-10-CM | POA: Diagnosis not present

## 2019-03-06 NOTE — Progress Notes (Signed)
Ashley Dickson; [id number]; 1960/09/04   HPI Patient is a 59 year old white female who was referred to my care by Dr. Dwana Melena for evaluation treatment of a ventral hernia.  Patient states the hernia has been present for over 8 years.  Seems to be increasing in size and is causing her discomfort.  Is located just above her umbilicus.  She is status post a laparoscopic cholecystectomy in the remote past.  She denies any nausea or vomiting.  She currently has 0 out of 10 abdominal pain.  It is made worse with straining or coughing. Past Medical History:  Diagnosis Date  . Anxiety   . Arthritis   . Diabetes mellitus without complication (HCC)   . GERD (gastroesophageal reflux disease)   . H pylori ulcer   . History of kidney stones   . Migraines   . Renal disorder   . Shingles   . Sleep apnea    not using CPAP.    Past Surgical History:  Procedure Laterality Date  . CHOLECYSTECTOMY    . CHONDROPLASTY Left 10/31/2018   Procedure: CHONDROPLASTY MEDIAL FEMORAL CONDYLE AND PATELLA;  Surgeon: Vickki Hearing, MD;  Location: [facility];  Service: Orthopedics;  Laterality: Left;  . CYSTOSCOPY     with stone extraction-multiple  . ESOPHAGOGASTRODUODENOSCOPY (EGD) WITH PROPOFOL N/A 11/21/2018   Procedure: ESOPHAGOGASTRODUODENOSCOPY (EGD) WITH PROPOFOL;  Surgeon: Corbin Ade, MD;  Incomplete due to large amount of retained gastric contents.   . ESOPHAGOGASTRODUODENOSCOPY (EGD) WITH PROPOFOL N/A 12/19/2018   Procedure: ESOPHAGOGASTRODUODENOSCOPY (EGD) WITH PROPOFOL;  Surgeon: Corbin Ade, MD; normal esophagus, normal stomach s/p biopsy, normal duodenal bulb and second portion of the duodenum.  Pathology with benign gastric mucosa.  No H. Pylori.    . excision of bone spur Bilateral    heels  . KNEE ARTHROSCOPY WITH LATERAL MENISECTOMY Left 10/31/2018   Procedure: LEFT KNEE ARTHROSCOPY WITH MEDIAL MENISCECTOMY;  Surgeon: Vickki Hearing, MD;  Location: [facility];  Service: Orthopedics;   Laterality: Left;  . TUBAL LIGATION      Family History  Problem Relation Age of Onset  . Diabetes Father   . Emphysema Father   . Colon cancer Neg Hx   . Colon polyps Neg Hx     Current Outpatient Medications on File Prior to Visit  Medication Sig Dispense Refill  . amitriptyline (ELAVIL) 25 MG tablet Take 25 mg by mouth daily as needed.    Marland Kitchen buPROPion (WELLBUTRIN XL) 300 MG 24 hr tablet Take 300 mg by mouth daily.    . chlorthalidone (HYGROTON) 25 MG tablet Take 25 mg by mouth daily.    . dapagliflozin propanediol (FARXIGA) 5 MG TABS tablet Take 5 mg by mouth daily.    Marland Kitchen escitalopram (LEXAPRO) 10 MG tablet Take 10 mg by mouth daily.     Marland Kitchen glipiZIDE (GLUCOTROL) 10 MG tablet Take 10 mg by mouth daily.    . irbesartan (AVAPRO) 75 MG tablet Take 75 mg by mouth daily.    . metFORMIN (GLUCOPHAGE) 1000 MG tablet Take 2,000 mg by mouth daily.    . metFORMIN (GLUMETZA) 1000 MG (MOD) 24 hr tablet Take 1,000 mg by mouth 2 (two) times daily with a meal.    . omeprazole (PRILOSEC) 40 MG capsule Take 1 capsule (40 mg total) by mouth 2 (two) times daily. 60 capsule 5  . polyethylene glycol-electrolytes (TRILYTE) 420 g solution Take 4,000 mLs by mouth as directed. 4000 mL 0  . promethazine (PHENERGAN) 12.5  MG tablet TAKE 1 TABLET BY MOUTH EVERY 6 HOURS AS NEEDED FOR NAUSEA AND VOMITING 30 tablet 1  . atorvastatin (LIPITOR) 40 MG tablet Take 40 mg by mouth daily.      No current facility-administered medications on file prior to visit.    Allergies  Allergen Reactions  . Azithromycin Anaphylaxis  . Tramadol Itching  . Ace Inhibitors Cough  . Sumatriptan Other (See Comments)    Chest pain  . Voltaren [Diclofenac] Swelling    Tongue/oral swelling.    Social History   Substance and Sexual Activity  Alcohol Use Yes   Comment: rarely    Social History   Tobacco Use  Smoking Status Never Smoker  Smokeless Tobacco Never Used    Review of Systems  Constitutional: Positive for  malaise/fatigue.  HENT: Positive for sinus pain.   Eyes: Positive for blurred vision.  Respiratory: Negative.   Cardiovascular: Negative.   Gastrointestinal: Positive for abdominal pain and heartburn.  Genitourinary: Positive for frequency.  Musculoskeletal: Positive for back pain and joint pain.  Neurological: Negative.   Endo/Heme/Allergies: Negative.   Psychiatric/Behavioral: Negative.     Objective   Vitals:   03/06/19 1014  BP: 118/78  Pulse: 83  Resp: 14  Temp: 97.8 F (36.6 C)  SpO2: 97%    Physical Exam Vitals reviewed.  Constitutional:      Appearance: Normal appearance. She is not ill-appearing.  HENT:     Head: Normocephalic and atraumatic.  Cardiovascular:     Rate and Rhythm: Normal rate and regular rhythm.     Heart sounds: Normal heart sounds. No murmur. No friction rub. No gallop.   Pulmonary:     Effort: Pulmonary effort is normal. No respiratory distress.     Breath sounds: Normal breath sounds. No stridor. No wheezing, rhonchi or rales.  Abdominal:     General: There is no distension.     Palpations: Abdomen is soft. There is no mass.     Tenderness: There is no abdominal tenderness. There is no guarding or rebound.     Hernia: A hernia is present.     Comments: Small supraumbilical hernia present in close proximity to supraumbilical surgical scar.  Reducible.  Skin:    General: Skin is warm and dry.  Neurological:     Mental Status: She is alert and oriented to person, place, and time.    Primary care notes reviewed Assessment  Incisional hernia Plan   Patient is scheduled for incisional herniorrhaphy with mesh on 04/04/2019.  The risks and benefits of the procedure including bleeding, infection, mesh use, the possibility of recurrence of the hernia were fully explained to the patient, who gave informed consent.

## 2019-03-06 NOTE — Patient Instructions (Signed)
Ventral Hernia  A ventral hernia is a bulge of tissue from inside the abdomen that pushes through a weak area of the muscles that form the front wall of the abdomen. The tissues inside the abdomen are inside a sac (peritoneum). These tissues include the small intestine, large intestine, and the fatty tissue that covers the intestines (omentum). Sometimes, the bulge that forms a hernia contains intestines. Other hernias contain only fat. Ventral hernias do not go away without surgical treatment. There are several types of ventral hernias. You may have:  A hernia at an incision site from previous abdominal surgery (incisional hernia).  A hernia just above the belly button (epigastric hernia), or at the belly button (umbilical hernia). These types of hernias can develop from heavy lifting or straining.  A hernia that comes and goes (reducible hernia). It may be visible only when you lift or strain. This type of hernia can be pushed back into the abdomen (reduced).  A hernia that traps abdominal tissue inside the hernia (incarcerated hernia). This type of hernia does not reduce.  A hernia that cuts off blood flow to the tissues inside the hernia (strangulated hernia). The tissues can start to die if this happens. This is a very painful bulge that cannot be reduced. A strangulated hernia is a medical emergency. What are the causes? This condition is caused by abdominal tissue putting pressure on an area of weakness in the abdominal muscles. What increases the risk? The following factors may make you more likely to develop this condition:  Being female.  Being 60 or older.  Being overweight or obese.  Having had previous abdominal surgery, especially if there was an infection after surgery.  Having had an injury to the abdominal wall.  Having had several pregnancies.  Having a buildup of fluid inside the abdomen (ascites). What are the signs or symptoms? The only symptom of a ventral hernia  may be a painless bulge in the abdomen. A reducible hernia may be visible only when you strain, cough, or lift. Other symptoms may include:  Dull pain.  A feeling of pressure. Signs and symptoms of a strangulated hernia may include:  Increasing pain.  Nausea and vomiting.  Pain when pressing on the hernia.  The skin over the hernia turning red or purple.  Constipation.  Blood in the stool (feces). How is this diagnosed? This condition may be diagnosed based on:  Your symptoms.  Your medical history.  A physical exam. You may be asked to cough or strain while standing. These actions increase the pressure inside your abdomen and force the hernia through the opening in your muscles. Your health care provider may try to reduce the hernia by pressing on it.  Imaging studies, such as an ultrasound or CT scan. How is this treated? This condition is treated with surgery. If you have a strangulated hernia, surgery is done as soon as possible. If your hernia is small and not incarcerated, you may be asked to lose some weight before surgery. Follow these instructions at home:  Follow instructions from your health care provider about eating or drinking restrictions.  If you are overweight, your health care provider may recommend that you increase your activity level and eat a healthier diet.  Do not lift anything that is heavier than 10 lb (4.5 kg).  Return to your normal activities as told by your health care provider. Ask your health care provider what activities are safe for you. You may need to avoid activities   that increase pressure on your hernia.  Take over-the-counter and prescription medicines only as told by your health care provider.  Keep all follow-up visits as told by your health care provider. This is important. Contact a health care provider if:  Your hernia gets larger.  Your hernia becomes painful. Get help right away if:  Your hernia becomes increasingly  painful.  You have pain along with any of the following: ? Changes in skin color in the area of the hernia. ? Nausea. ? Vomiting. ? Fever. Summary  A ventral hernia is a bulge of tissue from inside the abdomen that pushes through a weak area of the muscles that form the front wall of the abdomen.  This condition is treated with surgery, which may be urgent depending on your hernia.  Do not lift anything that is heavier than 10 lb (4.5 kg), and follow activity instructions from your health care provider. This information is not intended to replace advice given to you by your health care provider. Make sure you discuss any questions you have with your health care provider. Document Revised: 02/07/2017 Document Reviewed: 07/17/2016 Elsevier Patient Education  2020 [employer]  

## 2019-03-12 NOTE — H&P (Signed)
Ashley Dickson; [id number]; 1960/09/04   HPI Patient is a 59 year old white female who was referred to my care by Dr. Dwana Melena for evaluation treatment of a ventral hernia.  Patient states the hernia has been present for over 8 years.  Seems to be increasing in size and is causing her discomfort.  Is located just above her umbilicus.  She is status post a laparoscopic cholecystectomy in the remote past.  She denies any nausea or vomiting.  She currently has 0 out of 10 abdominal pain.  It is made worse with straining or coughing. Past Medical History:  Diagnosis Date  . Anxiety   . Arthritis   . Diabetes mellitus without complication (HCC)   . GERD (gastroesophageal reflux disease)   . H pylori ulcer   . History of kidney stones   . Migraines   . Renal disorder   . Shingles   . Sleep apnea    not using CPAP.    Past Surgical History:  Procedure Laterality Date  . CHOLECYSTECTOMY    . CHONDROPLASTY Left 10/31/2018   Procedure: CHONDROPLASTY MEDIAL FEMORAL CONDYLE AND PATELLA;  Surgeon: Vickki Hearing, MD;  Location: [facility];  Service: Orthopedics;  Laterality: Left;  . CYSTOSCOPY     with stone extraction-multiple  . ESOPHAGOGASTRODUODENOSCOPY (EGD) WITH PROPOFOL N/A 11/21/2018   Procedure: ESOPHAGOGASTRODUODENOSCOPY (EGD) WITH PROPOFOL;  Surgeon: Corbin Ade, MD;  Incomplete due to large amount of retained gastric contents.   . ESOPHAGOGASTRODUODENOSCOPY (EGD) WITH PROPOFOL N/A 12/19/2018   Procedure: ESOPHAGOGASTRODUODENOSCOPY (EGD) WITH PROPOFOL;  Surgeon: Corbin Ade, MD; normal esophagus, normal stomach s/p biopsy, normal duodenal bulb and second portion of the duodenum.  Pathology with benign gastric mucosa.  No H. Pylori.    . excision of bone spur Bilateral    heels  . KNEE ARTHROSCOPY WITH LATERAL MENISECTOMY Left 10/31/2018   Procedure: LEFT KNEE ARTHROSCOPY WITH MEDIAL MENISCECTOMY;  Surgeon: Vickki Hearing, MD;  Location: [facility];  Service: Orthopedics;   Laterality: Left;  . TUBAL LIGATION      Family History  Problem Relation Age of Onset  . Diabetes Father   . Emphysema Father   . Colon cancer Neg Hx   . Colon polyps Neg Hx     Current Outpatient Medications on File Prior to Visit  Medication Sig Dispense Refill  . amitriptyline (ELAVIL) 25 MG tablet Take 25 mg by mouth daily as needed.    Marland Kitchen buPROPion (WELLBUTRIN XL) 300 MG 24 hr tablet Take 300 mg by mouth daily.    . chlorthalidone (HYGROTON) 25 MG tablet Take 25 mg by mouth daily.    . dapagliflozin propanediol (FARXIGA) 5 MG TABS tablet Take 5 mg by mouth daily.    Marland Kitchen escitalopram (LEXAPRO) 10 MG tablet Take 10 mg by mouth daily.     Marland Kitchen glipiZIDE (GLUCOTROL) 10 MG tablet Take 10 mg by mouth daily.    . irbesartan (AVAPRO) 75 MG tablet Take 75 mg by mouth daily.    . metFORMIN (GLUCOPHAGE) 1000 MG tablet Take 2,000 mg by mouth daily.    . metFORMIN (GLUMETZA) 1000 MG (MOD) 24 hr tablet Take 1,000 mg by mouth 2 (two) times daily with a meal.    . omeprazole (PRILOSEC) 40 MG capsule Take 1 capsule (40 mg total) by mouth 2 (two) times daily. 60 capsule 5  . polyethylene glycol-electrolytes (TRILYTE) 420 g solution Take 4,000 mLs by mouth as directed. 4000 mL 0  . promethazine (PHENERGAN) 12.5  MG tablet TAKE 1 TABLET BY MOUTH EVERY 6 HOURS AS NEEDED FOR NAUSEA AND VOMITING 30 tablet 1  . atorvastatin (LIPITOR) 40 MG tablet Take 40 mg by mouth daily.      No current facility-administered medications on file prior to visit.    Allergies  Allergen Reactions  . Azithromycin Anaphylaxis  . Tramadol Itching  . Ace Inhibitors Cough  . Sumatriptan Other (See Comments)    Chest pain  . Voltaren [Diclofenac] Swelling    Tongue/oral swelling.    Social History   Substance and Sexual Activity  Alcohol Use Yes   Comment: rarely    Social History   Tobacco Use  Smoking Status Never Smoker  Smokeless Tobacco Never Used    Review of Systems  Constitutional: Positive for  malaise/fatigue.  HENT: Positive for sinus pain.   Eyes: Positive for blurred vision.  Respiratory: Negative.   Cardiovascular: Negative.   Gastrointestinal: Positive for abdominal pain and heartburn.  Genitourinary: Positive for frequency.  Musculoskeletal: Positive for back pain and joint pain.  Neurological: Negative.   Endo/Heme/Allergies: Negative.   Psychiatric/Behavioral: Negative.     Objective   Vitals:   03/06/19 1014  BP: 118/78  Pulse: 83  Resp: 14  Temp: 97.8 F (36.6 C)  SpO2: 97%    Physical Exam Vitals reviewed.  Constitutional:      Appearance: Normal appearance. She is not ill-appearing.  HENT:     Head: Normocephalic and atraumatic.  Cardiovascular:     Rate and Rhythm: Normal rate and regular rhythm.     Heart sounds: Normal heart sounds. No murmur. No friction rub. No gallop.   Pulmonary:     Effort: Pulmonary effort is normal. No respiratory distress.     Breath sounds: Normal breath sounds. No stridor. No wheezing, rhonchi or rales.  Abdominal:     General: There is no distension.     Palpations: Abdomen is soft. There is no mass.     Tenderness: There is no abdominal tenderness. There is no guarding or rebound.     Hernia: A hernia is present.     Comments: Small supraumbilical hernia present in close proximity to supraumbilical surgical scar.  Reducible.  Skin:    General: Skin is warm and dry.  Neurological:     Mental Status: She is alert and oriented to person, place, and time.    Primary care notes reviewed Assessment  Incisional hernia Plan   Patient is scheduled for incisional herniorrhaphy with mesh on 04/04/2019.  The risks and benefits of the procedure including bleeding, infection, mesh use, the possibility of recurrence of the hernia were fully explained to the patient, who gave informed consent. 

## 2019-03-31 NOTE — Patient Instructions (Signed)
Ashley Dickson  03/31/2019     @PREFPERIOPPHARMACY @   Your procedure is scheduled on  04/04/2019   Report to [facility] at  [place].  Call this number if you have problems the morning of surgery:  [phone]   Remember:  Do not eat or drink after midnight.                       Take these medicines the morning of surgery with A SIP OF WATER  Buproprion, zyrtec, chlorthalidone, lexapro, avapro, prilosec, phenergan(if needed). DO NOT take any medications for diabetes the morning of your surgery.    Do not wear jewelry, make-up or nail polish.  Do not wear lotions, powders, or perfumes. Please wear deodorant and brush your teeth.  Do not shave 48 hours prior to surgery.  Men may shave face and neck.  Do not bring valuables to the hospital.  [facility] is not responsible for any belongings or valuables.  Contacts, dentures or bridgework may not be worn into surgery.  Leave your suitcase in the car.  After surgery it may be brought to your room.  For patients admitted to the hospital, discharge time will be determined by your treatment team.  Patients discharged the day of surgery will not be allowed to drive home.   Name and phone number of your driver:   family Special instructions:  DO NOT smoke the morning of your procedure.  Please read over the following fact sheets that you were given. Anesthesia Post-op Instructions and Care and Recovery After Surgery       Open Hernia Repair, Adult, Care After These instructions give you information about caring for yourself after your procedure. Your doctor may also give you more specific instructions. If you have problems or questions, contact your doctor. Follow these instructions at home: Surgical cut (incision) care   Follow instructions from your doctor about how to take care of your surgical cut area. Make sure you: ? Wash your hands with soap and water before you change your bandage (dressing). If you  cannot use soap and water, use hand sanitizer. ? Change your bandage as told by your doctor. ? Leave stitches (sutures), skin glue, or skin tape (adhesive) strips in place. They may need to stay in place for 2 weeks or longer. If tape strips get loose and curl up, you may trim the loose edges. Do not remove tape strips completely unless your doctor says it is okay.  Check your surgical cut every day for signs of infection. Check for: ? More redness, swelling, or pain. ? More fluid or blood. ? Warmth. ? Pus or a bad smell. Activity  Do not drive or use heavy machinery while taking prescription pain medicine. Do not drive until your doctor says it is okay.  Until your doctor says it is okay: ? Do not lift anything that is heavier than 10 lb (4.5 kg). ? Do not play contact sports.  Return to your normal activities as told by your doctor. Ask your doctor what activities are safe. General instructions  To prevent or treat having a hard time pooping (constipation) while you are taking prescription pain medicine, your doctor may recommend that you: ? Drink enough fluid to keep your pee (urine) clear or pale yellow. ? Take over-the-counter or prescription medicines. ? Eat foods that are high in fiber, such as fresh fruits and vegetables, whole grains,  and beans. ? Limit foods that are high in fat and processed sugars, such as fried and sweet foods.  Take over-the-counter and prescription medicines only as told by your doctor.  Do not take baths, swim, or use a hot tub until your doctor says it is okay.  Keep all follow-up visits as told by your doctor. This is important. Contact a doctor if:  You develop a rash.  You have more redness, swelling, or pain around your surgical cut.  You have more fluid or blood coming from your surgical cut.  Your surgical cut feels warm to the touch.  You have pus or a bad smell coming from your surgical cut.  You have a fever or chills.  You have  blood in your poop (stool).  You have not pooped in 2-3 days.  Medicine does not help your pain. Get help right away if:  You have chest pain or you are short of breath.  You feel light-headed.  You feel weak and dizzy (feel faint).  You have very bad pain.  You throw up (vomit) and your pain is worse. This information is not intended to replace advice given to you by your health care provider. Make sure you discuss any questions you have with your health care provider. Document Revised: 04/19/2018 Document Reviewed: 06/09/2015 Elsevier Patient Education  2020 [employer]  General Anesthesia, Adult, Care After This sheet gives you information about how to care for yourself after your procedure. Your health care provider may also give you more specific instructions. If you have problems or questions, contact your health care provider. What can I expect after the procedure? After the procedure, the following side effects are common:  Pain or discomfort at the IV site.  Nausea.  Vomiting.  Sore throat.  Trouble concentrating.  Feeling cold or chills.  Weak or tired.  Sleepiness and fatigue.  Soreness and body aches. These side effects can affect parts of the body that were not involved in surgery. Follow these instructions at home:  For at least 24 hours after the procedure:  Have a responsible adult stay with you. It is important to have someone help care for you until you are awake and alert.  Rest as needed.  Do not: ? Participate in activities in which you could fall or become injured. ? Drive. ? Use heavy machinery. ? Drink alcohol. ? Take sleeping pills or medicines that cause drowsiness. ? Make important decisions or sign legal documents. ? Take care of children on your own. Eating and drinking  Follow any instructions from your health care provider about eating or drinking restrictions.  When you feel hungry, start by eating small amounts of foods  that are soft and easy to digest (bland), such as toast. Gradually return to your regular diet.  Drink enough fluid to keep your urine pale yellow.  If you vomit, rehydrate by drinking water, juice, or clear broth. General instructions  If you have sleep apnea, surgery and certain medicines can increase your risk for breathing problems. Follow instructions from your health care provider about wearing your sleep device: ? Anytime you are sleeping, including during daytime naps. ? While taking prescription pain medicines, sleeping medicines, or medicines that make you drowsy.  Return to your normal activities as told by your health care provider. Ask your health care provider what activities are safe for you.  Take over-the-counter and prescription medicines only as told by your health care provider.  If you smoke, do  not smoke without supervision.  Keep all follow-up visits as told by your health care provider. This is important. Contact a health care provider if:  You have nausea or vomiting that does not get better with medicine.  You cannot eat or drink without vomiting.  You have pain that does not get better with medicine.  You are unable to pass urine.  You develop a skin rash.  You have a fever.  You have redness around your IV site that gets worse. Get help right away if:  You have difficulty breathing.  You have chest pain.  You have blood in your urine or stool, or you vomit blood. Summary  After the procedure, it is common to have a sore throat or nausea. It is also common to feel tired.  Have a responsible adult stay with you for the first 24 hours after general anesthesia. It is important to have someone help care for you until you are awake and alert.  When you feel hungry, start by eating small amounts of foods that are soft and easy to digest (bland), such as toast. Gradually return to your regular diet.  Drink enough fluid to keep your urine pale  yellow.  Return to your normal activities as told by your health care provider. Ask your health care provider what activities are safe for you. This information is not intended to replace advice given to you by your health care provider. Make sure you discuss any questions you have with your health care provider. Document Revised: 12/29/2016 Document Reviewed: 08/11/2016 Elsevier Patient Education  2020 [employer] How to Use Chlorhexidine for Bathing Chlorhexidine gluconate (CHG) is a germ-killing (antiseptic) solution that is used to clean the skin. It can get rid of the bacteria that normally live on the skin and can keep them away for about 24 hours. To clean your skin with CHG, you may be given:  A CHG solution to use in the shower or as part of a sponge bath.  A prepackaged cloth that contains CHG. Cleaning your skin with CHG may help lower the risk for infection:  While you are staying in the intensive care unit of the hospital.  If you have a vascular access, such as a central line, to provide short-term or long-term access to your veins.  If you have a catheter to drain urine from your bladder.  If you are on a ventilator. A ventilator is a machine that helps you breathe by moving air in and out of your lungs.  After surgery. What are the risks? Risks of using CHG include:  A skin reaction.  Hearing loss, if CHG gets in your ears.  Eye injury, if CHG gets in your eyes and is not rinsed out.  The CHG product catching fire. Make sure that you avoid smoking and flames after applying CHG to your skin. Do not use CHG:  If you have a chlorhexidine allergy or have previously reacted to chlorhexidine.  On babies younger than 332 months of age. How to use CHG solution  Use CHG only as told by your health care provider, and follow the instructions on the label.  Use the full amount of CHG as directed. Usually, this is one bottle. During a shower Follow these steps when  using CHG solution during a shower (unless your health care provider gives you different instructions): 1. Start the shower. 2. Use your normal soap and shampoo to wash your face and hair. 3. Turn off the shower  or move out of the shower stream. 4. Pour the CHG onto a clean washcloth. Do not use any type of brush or rough-edged sponge. 5. Starting at your neck, lather your body down to your toes. Make sure you follow these instructions: ? If you will be having surgery, pay special attention to the part of your body where you will be having surgery. Scrub this area for at least 1 minute. ? Do not use CHG on your head or face. If the solution gets into your ears or eyes, rinse them well with water. ? Avoid your genital area. ? Avoid any areas of skin that have broken skin, cuts, or scrapes. ? Scrub your back and under your arms. Make sure to wash skin folds. 6. Let the lather sit on your skin for 1-2 minutes or as long as told by your health care provider. 7. Thoroughly rinse your entire body in the shower. Make sure that all body creases and crevices are rinsed well. 8. Dry off with a clean towel. Do not put any substances on your body afterward--such as powder, lotion, or perfume--unless you are told to do so by your health care provider. Only use lotions that are recommended by the manufacturer. 9. Put on clean clothes or pajamas. 10. If it is the night before your surgery, sleep in clean sheets.  During a sponge bath Follow these steps when using CHG solution during a sponge bath (unless your health care provider gives you different instructions): 1. Use your normal soap and shampoo to wash your face and hair. 2. Pour the CHG onto a clean washcloth. 3. Starting at your neck, lather your body down to your toes. Make sure you follow these instructions: ? If you will be having surgery, pay special attention to the part of your body where you will be having surgery. Scrub this area for at least 1  minute. ? Do not use CHG on your head or face. If the solution gets into your ears or eyes, rinse them well with water. ? Avoid your genital area. ? Avoid any areas of skin that have broken skin, cuts, or scrapes. ? Scrub your back and under your arms. Make sure to wash skin folds. 4. Let the lather sit on your skin for 1-2 minutes or as long as told by your health care provider. 5. Using a different clean, wet washcloth, thoroughly rinse your entire body. Make sure that all body creases and crevices are rinsed well. 6. Dry off with a clean towel. Do not put any substances on your body afterward--such as powder, lotion, or perfume--unless you are told to do so by your health care provider. Only use lotions that are recommended by the manufacturer. 7. Put on clean clothes or pajamas. 8. If it is the night before your surgery, sleep in clean sheets. How to use CHG prepackaged cloths  Only use CHG cloths as told by your health care provider, and follow the instructions on the label.  Use the CHG cloth on clean, dry skin.  Do not use the CHG cloth on your head or face unless your health care provider tells you to.  When washing with the CHG cloth: ? Avoid your genital area. ? Avoid any areas of skin that have broken skin, cuts, or scrapes. Before surgery Follow these steps when using a CHG cloth to clean before surgery (unless your health care provider gives you different instructions): 1. Using the CHG cloth, vigorously scrub the part of your  body where you will be having surgery. Scrub using a back-and-forth motion for 3 minutes. The area on your body should be completely wet with CHG when you are done scrubbing. 2. Do not rinse. Discard the cloth and let the area air-dry. Do not put any substances on the area afterward, such as powder, lotion, or perfume. 3. Put on clean clothes or pajamas. 4. If it is the night before your surgery, sleep in clean sheets.  For general bathing Follow these  steps when using CHG cloths for general bathing (unless your health care provider gives you different instructions). 1. Use a separate CHG cloth for each area of your body. Make sure you wash between any folds of skin and between your fingers and toes. Wash your body in the following order, switching to a new cloth after each step: ? The front of your neck, shoulders, and chest. ? Both of your arms, under your arms, and your hands. ? Your stomach and groin area, avoiding the genitals. ? Your right leg and foot. ? Your left leg and foot. ? The back of your neck, your back, and your buttocks. 2. Do not rinse. Discard the cloth and let the area air-dry. Do not put any substances on your body afterward--such as powder, lotion, or perfume--unless you are told to do so by your health care provider. Only use lotions that are recommended by the manufacturer. 3. Put on clean clothes or pajamas. Contact a health care provider if:  Your skin gets irritated after scrubbing.  You have questions about using your solution or cloth. Get help right away if:  Your eyes become very red or swollen.  Your eyes itch badly.  Your skin itches badly and is red or swollen.  Your hearing changes.  You have trouble seeing.  You have swelling or tingling in your mouth or throat.  You have trouble breathing.  You swallow any chlorhexidine. Summary  Chlorhexidine gluconate (CHG) is a germ-killing (antiseptic) solution that is used to clean the skin. Cleaning your skin with CHG may help to lower your risk for infection.  You may be given CHG to use for bathing. It may be in a bottle or in a prepackaged cloth to use on your skin. Carefully follow your health care provider's instructions and the instructions on the product label.  Do not use CHG if you have a chlorhexidine allergy.  Contact your health care provider if your skin gets irritated after scrubbing. This information is not intended to replace  advice given to you by your health care provider. Make sure you discuss any questions you have with your health care provider. Document Revised: 03/14/2018 Document Reviewed: 11/23/2016 Elsevier Patient Education  2020 [employer].

## 2019-04-02 ENCOUNTER — Encounter (HOSPITAL_COMMUNITY)
Admission: RE | Admit: 2019-04-02 | Discharge: 2019-04-02 | Disposition: A | Discharge status: Home / Self Care | Payer: BC Managed Care – PPO | Source: Ambulatory Visit | Attending: General Surgery | Admitting: General Surgery

## 2019-04-02 ENCOUNTER — Other Ambulatory Visit: Payer: Self-pay

## 2019-04-02 ENCOUNTER — Encounter (HOSPITAL_COMMUNITY): Payer: Self-pay

## 2019-04-02 ENCOUNTER — Other Ambulatory Visit (HOSPITAL_COMMUNITY)
Admission: RE | Admit: 2019-04-02 | Discharge: 2019-04-02 | Disposition: A | Discharge status: Home / Self Care | Payer: BC Managed Care – PPO | Source: Ambulatory Visit | Attending: General Surgery | Admitting: General Surgery

## 2019-04-02 DIAGNOSIS — Z20822 Contact with and (suspected) exposure to covid-19: Secondary | ICD-10-CM | POA: Insufficient documentation

## 2019-04-02 DIAGNOSIS — Z01812 Encounter for preprocedural laboratory examination: Secondary | ICD-10-CM | POA: Diagnosis not present

## 2019-04-02 HISTORY — DX: Pure hypercholesterolemia, unspecified: E78.00

## 2019-04-02 HISTORY — DX: Gastroparesis: K31.84

## 2019-04-02 LAB — CBC WITH DIFFERENTIAL/PLATELET
Abs Immature Granulocytes: 0.01 10*3/uL (ref 0.00–0.07)
Basophils Absolute: 0.1 10*3/uL (ref 0.0–0.1)
Basophils Relative: 1 %
Eosinophils Absolute: 0.2 10*3/uL (ref 0.0–0.5)
Eosinophils Relative: 3 %
HCT: 38.2 % (ref 36.0–46.0)
Hemoglobin: 12.2 g/dL (ref 12.0–15.0)
Immature Granulocytes: 0 %
Lymphocytes Relative: 37 %
Lymphs Abs: 2.3 10*3/uL (ref 0.7–4.0)
MCH: 31.7 pg (ref 26.0–34.0)
MCHC: 31.9 g/dL (ref 30.0–36.0)
MCV: 99.2 fL (ref 80.0–100.0)
Monocytes Absolute: 0.6 10*3/uL (ref 0.1–1.0)
Monocytes Relative: 10 %
Neutro Abs: 3 10*3/uL (ref 1.7–7.7)
Neutrophils Relative %: 49 %
Platelets: 235 10*3/uL (ref 150–400)
RBC: 3.85 MIL/uL — ABNORMAL LOW (ref 3.87–5.11)
RDW: 13.2 % (ref 11.5–15.5)
WBC: 6 10*3/uL (ref 4.0–10.5)
nRBC: 0 % (ref 0.0–0.2)

## 2019-04-02 LAB — BASIC METABOLIC PANEL
Anion gap: 14 (ref 5–15)
BUN: 13 mg/dL (ref 6–20)
CO2: 21 mmol/L — ABNORMAL LOW (ref 22–32)
Calcium: 9.5 mg/dL (ref 8.9–10.3)
Chloride: 104 mmol/L (ref 98–111)
Creatinine, Ser: 1.14 mg/dL — ABNORMAL HIGH (ref 0.44–1.00)
GFR calc Af Amer: >60 mL/min (ref >60)
GFR calc non Af Amer: 53 mL/min — ABNORMAL LOW (ref >60)
Glucose, Bld: 145 mg/dL — ABNORMAL HIGH (ref 70–99)
Potassium: 3.5 mmol/L (ref 3.5–5.1)
Sodium: 139 mmol/L (ref 135–145)

## 2019-04-02 LAB — SARS CORONAVIRUS 2 (TAT 6-24 HRS): SARS Coronavirus 2: NEGATIVE

## 2019-04-02 LAB — HEMOGLOBIN A1C
Hgb A1c MFr Bld: 6.9 % — ABNORMAL HIGH (ref 4.8–5.6)
Mean Plasma Glucose: 151.33 mg/dL

## 2019-04-04 ENCOUNTER — Ambulatory Visit (HOSPITAL_COMMUNITY)
Admission: RE | Admit: 2019-04-04 | Discharge: 2019-04-04 | Disposition: A | Discharge status: Home / Self Care | Payer: BC Managed Care – PPO | Attending: General Surgery | Admitting: General Surgery

## 2019-04-04 ENCOUNTER — Encounter (HOSPITAL_COMMUNITY): Payer: Self-pay | Admitting: General Surgery

## 2019-04-04 ENCOUNTER — Encounter (HOSPITAL_COMMUNITY)
Admission: RE | Disposition: A | Discharge status: Home / Self Care | Payer: Self-pay | Source: Home / Self Care | Attending: General Surgery

## 2019-04-04 ENCOUNTER — Ambulatory Visit (HOSPITAL_COMMUNITY): Payer: BC Managed Care – PPO | Admitting: Anesthesiology

## 2019-04-04 DIAGNOSIS — K219 Gastro-esophageal reflux disease without esophagitis: Secondary | ICD-10-CM | POA: Insufficient documentation

## 2019-04-04 DIAGNOSIS — Z881 Allergy status to other antibiotic agents status: Secondary | ICD-10-CM | POA: Diagnosis not present

## 2019-04-04 DIAGNOSIS — G43909 Migraine, unspecified, not intractable, without status migrainosus: Secondary | ICD-10-CM | POA: Insufficient documentation

## 2019-04-04 DIAGNOSIS — K279 Peptic ulcer, site unspecified, unspecified as acute or chronic, without hemorrhage or perforation: Secondary | ICD-10-CM | POA: Diagnosis not present

## 2019-04-04 DIAGNOSIS — Z7984 Long term (current) use of oral hypoglycemic drugs: Secondary | ICD-10-CM | POA: Diagnosis not present

## 2019-04-04 DIAGNOSIS — E119 Type 2 diabetes mellitus without complications: Secondary | ICD-10-CM | POA: Diagnosis not present

## 2019-04-04 DIAGNOSIS — G4730 Sleep apnea, unspecified: Secondary | ICD-10-CM | POA: Diagnosis not present

## 2019-04-04 DIAGNOSIS — Z79899 Other long term (current) drug therapy: Secondary | ICD-10-CM | POA: Insufficient documentation

## 2019-04-04 DIAGNOSIS — I10 Essential (primary) hypertension: Secondary | ICD-10-CM | POA: Insufficient documentation

## 2019-04-04 DIAGNOSIS — Z9049 Acquired absence of other specified parts of digestive tract: Secondary | ICD-10-CM | POA: Diagnosis not present

## 2019-04-04 DIAGNOSIS — N289 Disorder of kidney and ureter, unspecified: Secondary | ICD-10-CM | POA: Diagnosis not present

## 2019-04-04 DIAGNOSIS — F419 Anxiety disorder, unspecified: Secondary | ICD-10-CM | POA: Insufficient documentation

## 2019-04-04 DIAGNOSIS — Z888 Allergy status to other drugs, medicaments and biological substances status: Secondary | ICD-10-CM | POA: Diagnosis not present

## 2019-04-04 DIAGNOSIS — Z833 Family history of diabetes mellitus: Secondary | ICD-10-CM | POA: Insufficient documentation

## 2019-04-04 DIAGNOSIS — M1990 Unspecified osteoarthritis, unspecified site: Secondary | ICD-10-CM | POA: Diagnosis not present

## 2019-04-04 DIAGNOSIS — Z825 Family history of asthma and other chronic lower respiratory diseases: Secondary | ICD-10-CM | POA: Diagnosis not present

## 2019-04-04 DIAGNOSIS — Z87442 Personal history of urinary calculi: Secondary | ICD-10-CM | POA: Diagnosis not present

## 2019-04-04 DIAGNOSIS — K432 Incisional hernia without obstruction or gangrene: Secondary | ICD-10-CM | POA: Diagnosis not present

## 2019-04-04 DIAGNOSIS — Z885 Allergy status to narcotic agent status: Secondary | ICD-10-CM | POA: Diagnosis not present

## 2019-04-04 HISTORY — PX: INCISIONAL HERNIA REPAIR: SHX193

## 2019-04-04 LAB — GLUCOSE, CAPILLARY
Glucose-Capillary: 134 mg/dL — ABNORMAL HIGH (ref 70–99)
Glucose-Capillary: 136 mg/dL — ABNORMAL HIGH (ref 70–99)

## 2019-04-04 SURGERY — REPAIR, HERNIA, INCISIONAL
Anesthesia: General | Site: Abdomen

## 2019-04-04 MED ORDER — CHLORHEXIDINE GLUCONATE CLOTH 2 % EX PADS: 6.0000 | MEDICATED_PAD | Freq: Once | CUTANEOUS | Status: DC

## 2019-04-04 MED ORDER — LACTATED RINGERS IV SOLN: INTRAVENOUS | Status: DC | PRN

## 2019-04-04 MED ORDER — SUCCINYLCHOLINE CHLORIDE 200 MG/10ML IV SOSY
PREFILLED_SYRINGE | INTRAVENOUS | Status: AC
  Filled 2019-04-04: qty 10

## 2019-04-04 MED ORDER — FENTANYL CITRATE (PF) 100 MCG/2ML IJ SOLN
25.0000 ug | INTRAMUSCULAR | Status: DC | PRN
  Administered 2019-04-04 (×2): 50 ug via INTRAVENOUS
  Filled 2019-04-04: qty 2

## 2019-04-04 MED ORDER — FENTANYL CITRATE (PF) 250 MCG/5ML IJ SOLN
INTRAMUSCULAR | Status: AC
  Filled 2019-04-04: qty 5

## 2019-04-04 MED ORDER — ONDANSETRON HCL 4 MG/2ML IJ SOLN
INTRAMUSCULAR | Status: AC
  Filled 2019-04-04: qty 4

## 2019-04-04 MED ORDER — PROPOFOL 10 MG/ML IV BOLUS
INTRAVENOUS | Status: AC
  Filled 2019-04-04: qty 40

## 2019-04-04 MED ORDER — SUCCINYLCHOLINE 20MG/ML (10ML) SYRINGE FOR MEDFUSION PUMP - OPTIME
INTRAMUSCULAR | Status: DC | PRN
  Administered 2019-04-04: 100 mg via INTRAVENOUS

## 2019-04-04 MED ORDER — BUPIVACAINE LIPOSOME 1.3 % IJ SUSP
INTRAMUSCULAR | Status: DC | PRN
  Administered 2019-04-04: 20 mL

## 2019-04-04 MED ORDER — LIDOCAINE HCL (CARDIAC) PF 50 MG/5ML IV SOSY
PREFILLED_SYRINGE | INTRAVENOUS | Status: DC | PRN
  Administered 2019-04-04: 60 mg via INTRAVENOUS

## 2019-04-04 MED ORDER — 0.9 % SODIUM CHLORIDE (POUR BTL) OPTIME
TOPICAL | Status: DC | PRN
  Administered 2019-04-04: 1000 mL

## 2019-04-04 MED ORDER — CEFAZOLIN SODIUM-DEXTROSE 2-4 GM/100ML-% IV SOLN
INTRAVENOUS | Status: AC
  Filled 2019-04-04: qty 100

## 2019-04-04 MED ORDER — FENTANYL CITRATE (PF) 100 MCG/2ML IJ SOLN
INTRAMUSCULAR | Status: DC | PRN
  Administered 2019-04-04 (×2): 50 ug via INTRAVENOUS

## 2019-04-04 MED ORDER — MIDAZOLAM HCL 5 MG/5ML IJ SOLN
INTRAMUSCULAR | Status: DC | PRN
  Administered 2019-04-04: 2 mg via INTRAVENOUS

## 2019-04-04 MED ORDER — BUPIVACAINE LIPOSOME 1.3 % IJ SUSP
INTRAMUSCULAR | Status: AC
  Filled 2019-04-04: qty 20

## 2019-04-04 MED ORDER — ONDANSETRON HCL 4 MG/2ML IJ SOLN
INTRAMUSCULAR | Status: DC | PRN
  Administered 2019-04-04: 4 mg via INTRAVENOUS

## 2019-04-04 MED ORDER — MIDAZOLAM HCL 2 MG/2ML IJ SOLN
INTRAMUSCULAR | Status: AC
  Filled 2019-04-04: qty 2

## 2019-04-04 MED ORDER — LACTATED RINGERS IV SOLN: INTRAVENOUS | Status: DC

## 2019-04-04 MED ORDER — HYDROCODONE-ACETAMINOPHEN 5-325 MG PO TABS: 1.0000 | ORAL_TABLET | ORAL | 0 refills | Status: AC | PRN

## 2019-04-04 MED ORDER — CEFAZOLIN SODIUM-DEXTROSE 2-4 GM/100ML-% IV SOLN
2.0000 g | INTRAVENOUS | Status: AC
  Administered 2019-04-04: 2 g via INTRAVENOUS

## 2019-04-04 MED ORDER — ONDANSETRON HCL 4 MG PO TABS: 4.0000 mg | ORAL_TABLET | Freq: Three times a day (TID) | ORAL | 0 refills | Status: AC | PRN

## 2019-04-04 MED ORDER — PROPOFOL 10 MG/ML IV BOLUS
INTRAVENOUS | Status: DC | PRN
  Administered 2019-04-04: 160 mg via INTRAVENOUS

## 2019-04-04 SURGICAL SUPPLY — 38 items
ADH SKN CLS APL DERMABOND .7 (GAUZE/BANDAGES/DRESSINGS) ×1
APL PRP STRL LF DISP 70% ISPRP (MISCELLANEOUS) ×1
BLADE SURG SZ11 CARB STEEL (BLADE) ×3 IMPLANT
CHLORAPREP W/TINT 26 (MISCELLANEOUS) ×3 IMPLANT
CLOTH BEACON ORANGE TIMEOUT ST (SAFETY) ×3 IMPLANT
COVER LIGHT HANDLE STERIS (MISCELLANEOUS) ×6 IMPLANT
COVER WAND RF STERILE (DRAPES) ×3 IMPLANT
DERMABOND ADVANCED (GAUZE/BANDAGES/DRESSINGS) ×2
DERMABOND ADVANCED .7 DNX12 (GAUZE/BANDAGES/DRESSINGS) ×1 IMPLANT
ELECT REM PT RETURN 9FT ADLT (ELECTROSURGICAL) ×3
ELECTRODE REM PT RTRN 9FT ADLT (ELECTROSURGICAL) ×1 IMPLANT
GLOVE BIO SURGEON STRL SZ7 (GLOVE) ×2 IMPLANT
GLOVE BIO SURGEON STRL SZ7.5 (GLOVE) ×2 IMPLANT
GLOVE BIOGEL PI IND STRL 7.0 (GLOVE) ×2 IMPLANT
GLOVE BIOGEL PI IND STRL 7.5 (GLOVE) IMPLANT
GLOVE BIOGEL PI INDICATOR 7.0 (GLOVE) ×4
GLOVE BIOGEL PI INDICATOR 7.5 (GLOVE) ×2
GLOVE SURG SS PI 7.5 STRL IVOR (GLOVE) ×3 IMPLANT
GOWN STRL REUS W/TWL LRG LVL3 (GOWN DISPOSABLE) ×7 IMPLANT
GOWN STRL REUS W/TWL XL LVL3 (GOWN DISPOSABLE) ×2 IMPLANT
INST SET MINOR GENERAL (KITS) ×2 IMPLANT
KIT TURNOVER KIT A (KITS) ×3 IMPLANT
MANIFOLD NEPTUNE II (INSTRUMENTS) ×3 IMPLANT
MESH VENTRALEX ST 1-7/10 CRC S (Mesh General) ×2 IMPLANT
NDL HYPO 21X1.5 SAFETY (NEEDLE) ×1 IMPLANT
NEEDLE HYPO 21X1.5 SAFETY (NEEDLE) ×3 IMPLANT
NS IRRIG 1000ML POUR BTL (IV SOLUTION) ×3 IMPLANT
PACK MINOR (CUSTOM PROCEDURE TRAY) ×2 IMPLANT
PAD ARMBOARD 7.5X6 YLW CONV (MISCELLANEOUS) ×3 IMPLANT
SET BASIN LINEN APH (SET/KITS/TRAYS/PACK) ×3 IMPLANT
SUT ETHIBOND 0 MO6 C/R (SUTURE) ×2 IMPLANT
SUT MNCRL AB 4-0 PS2 18 (SUTURE) ×3 IMPLANT
SUT VIC AB 2-0 CT1 27 (SUTURE) ×3
SUT VIC AB 2-0 CT1 TAPERPNT 27 (SUTURE) ×1 IMPLANT
SUT VIC AB 3-0 SH 27 (SUTURE) ×3
SUT VIC AB 3-0 SH 27X BRD (SUTURE) ×1 IMPLANT
SUT VICRYL AB 2 0 TIES (SUTURE) ×2 IMPLANT
SYR 20ML LL LF (SYRINGE) ×6 IMPLANT

## 2019-04-04 NOTE — Anesthesia Preprocedure Evaluation (Signed)
Anesthesia Evaluation  Patient identified by MRN, date of birth, ID band Patient awake    Reviewed: Allergy & Precautions, H&P , NPO status , Patient's Chart, lab work & pertinent test results, reviewed documented beta blocker date and time   Airway Mallampati: II  TM Distance: >3 FB     Dental no notable dental hx.    Pulmonary sleep apnea ,    Pulmonary exam normal        Cardiovascular hypertension, Normal cardiovascular exam     Neuro/Psych  Headaches, PSYCHIATRIC DISORDERS Anxiety Depression    GI/Hepatic PUD, GERD  ,  Endo/Other  diabetes  Renal/GU Renal disease     Musculoskeletal   Abdominal   Peds  Hematology   Anesthesia Other Findings   Reproductive/Obstetrics                             Anesthesia Physical Anesthesia Plan  ASA: II  Anesthesia Plan: General   Post-op Pain Management:    Induction:   PONV Risk Score and Plan: 2 and Ondansetron  Airway Management Planned:   Additional Equipment:   Intra-op Plan:   Post-operative Plan:   Informed Consent: I have reviewed the patients History and Physical, chart, labs and discussed the procedure including the risks, benefits and alternatives for the proposed anesthesia with the patient or authorized representative who has indicated his/her understanding and acceptance.       Plan Discussed with: CRNA  Anesthesia Plan Comments:         Anesthesia Quick Evaluation

## 2019-04-04 NOTE — Interval H&P Note (Signed)
History and Physical Interval Note:  04/04/2019 7:13 AM  Ashley Dickson  has presented today for surgery, with the diagnosis of Incisional hernia.  The various methods of treatment have been discussed with the patient and family. After consideration of risks, benefits and other options for treatment, the patient has consented to  Procedure(s): HERNIA REPAIR INCISIONAL WITH MESH (N/A) as a surgical intervention.  The patient's history has been reviewed, patient examined, no change in status, stable for surgery.  I have reviewed the patient's chart and labs.  Questions were answered to the patient's satisfaction.     Franky Macho

## 2019-04-04 NOTE — Anesthesia Postprocedure Evaluation (Signed)
Anesthesia Post Note  Patient: [name]  Procedure(s) Performed: HERNIA REPAIR INCISIONAL WITH MESH (N/A Abdomen)  Patient location during evaluation: Phase II Anesthesia Type: General Level of consciousness: awake and alert and patient cooperative Pain management: satisfactory to patient Vital Signs Assessment: post-procedure vital signs reviewed and stable Respiratory status: spontaneous breathing Cardiovascular status: stable Postop Assessment: no apparent nausea or vomiting Anesthetic complications: no     Last Vitals:  Vitals:   04/04/19 0845 04/04/19 0903  BP: 108/72 116/80  Pulse: 75 73  Resp: 17 17  Temp:  36.9 C  SpO2: 95% 97%    Last Pain:  Vitals:   04/04/19 0903  TempSrc: Oral  PainSc: 5                  Maciah Schweigert

## 2019-04-04 NOTE — Transfer of Care (Signed)
Immediate Anesthesia Transfer of Care Note  Patient: Ashley Dickson  Procedure(s) Performed: HERNIA REPAIR INCISIONAL WITH MESH (N/A Abdomen)  Patient Location: PACU  Anesthesia Type:General  Level of Consciousness: awake  Airway & Oxygen Therapy: Patient Spontanous Breathing  Post-op Assessment: Report given to RN  Post vital signs: Reviewed and stable  Last Vitals:  Vitals Value Taken Time  BP 117/72 04/04/19 0822  Temp    Pulse 73 04/04/19 0825  Resp 18 04/04/19 0825  SpO2 100 % 04/04/19 0825  Vitals shown include unvalidated device data.  Last Pain:  Vitals:   04/04/19 0700  TempSrc: Oral  PainSc: 0-No pain      Patients Stated Pain Goal: 8 (04/04/19 0700)  Complications: No apparent anesthesia complications

## 2019-04-04 NOTE — Anesthesia Procedure Notes (Signed)
Procedure Name: Intubation Date/Time: 04/04/2019 7:37 AM Performed by: Ollen Bowl, CRNA Pre-anesthesia Checklist: Patient identified, Patient being monitored, Timeout performed, Emergency Drugs available and Suction available Patient Re-evaluated:Patient Re-evaluated prior to induction Oxygen Delivery Method: Circle system utilized Preoxygenation: Pre-oxygenation with 100% oxygen Induction Type: IV induction Ventilation: Mask ventilation without difficulty Laryngoscope Size: Mac and 3 Grade View: Grade I Tube type: Oral Tube size: 7.0 mm Number of attempts: 1 Airway Equipment and Method: Stylet Placement Confirmation: ETT inserted through vocal cords under direct vision,  positive ETCO2 and breath sounds checked- equal and bilateral Secured at: 21 cm Tube secured with: Tape Dental Injury: Teeth and Oropharynx as per pre-operative assessment

## 2019-04-04 NOTE — Op Note (Signed)
Patient:  Ashley Dickson  DOB:  1960/10/22  MRN:  [id number]   Preop Diagnosis: Incisional hernia  Postop Diagnosis: Same  Procedure: Incisional herniorrhaphy with mesh  Surgeon: Franky Macho, MD  Anes: General endotracheal  Indications: Patient is a 59 year old white female who developed an incisional hernia in the supraumbilical region, status post laparoscopic cholecystectomy in the past.  The risks and benefits of the procedure including bleeding, infection, mesh use, and the possibility of an open procedure were fully explained to the patient, who gave informed consent.  Procedure note: The patient was placed in supine position.  After induction of general endotracheal anesthesia, the abdomen was prepped and draped using the usual sterile technique with ChloraPrep.  Surgical site confirmation was performed.  A supraumbilical incision was made down to the fascia.  The 1 cm hernia defect was noted at the fascial level.  Omentum was noted to be emanating into the hernia sac.  The hernia sac was excised as well as the excess omentum.  Care was taken to avoid any bowel.  The omentum was ligated using a 2-0 Vicryl tie.  It was reduced into the abdominal cavity.  The anterior abdominal wall was then palpated and no other defects or adhesions were noted.  A Bard 4.3 cm Ventralax ST patch was inserted and secured to the fascia using 0 Ethibond interrupted sutures.  The overlying fascia was reapproximated using an 0 Vicryl interrupted suture.  The subcutaneous layer was reapproximated using a 3-0 Vicryl interrupted suture.  Exparel was instilled into the surrounding wound.  The skin was closed using a 4-0 Monocryl subcuticular suture.  Dermabond was applied.  All tape and needle counts were correct at the end of the procedure.  The patient was extubated in the operating room and transferred to PACU in stable condition.  Complications: None  EBL: Minimal  Specimen: None

## 2019-04-04 NOTE — Discharge Instructions (Signed)
PLEASE LEAVE TEAL EXPAREL BRACELET ON UNTIL Tuesday MARCH 30TH, 2021 DO NOT HAVE ANY FURTHER NUMBING MEDICATION UNTIL AFTER MARCH 30TH       Open Hernia Repair, Adult, Care After This sheet gives you information about how to care for yourself after your procedure. Your health care provider may also give you more specific instructions. If you have problems or questions, contact your health care provider. What can I expect after the procedure? After the procedure, it is common to have:  Mild discomfort.  Slight bruising.  Minor swelling.  Pain in the abdomen. Follow these instructions at home: Incision care   Follow instructions from your health care provider about how to take care of your incision area. Make sure you: ? Wash your hands with soap and water before you change your bandage (dressing). If soap and water are not available, use hand sanitizer. ? Change your dressing as told by your health care provider. ? Leave stitches (sutures), skin glue, or adhesive strips in place. These skin closures may need to stay in place for 2 weeks or longer. If adhesive strip edges start to loosen and curl up, you may trim the loose edges. Do not remove adhesive strips completely unless your health care provider tells you to do that.  Check your incision area every day for signs of infection. Check for: ? More redness, swelling, or pain. ? More fluid or blood. ? Warmth. ? Pus or a bad smell. Activity  Do not drive or use heavy machinery while taking prescription pain medicine. Do not drive until your health care provider approves.  Until your health care provider approves: ? Do not lift anything that is heavier than 10 lb (4.5 kg). ? Do not play contact sports.  Return to your normal activities as told by your health care provider. Ask your health care provider what activities are safe. General instructions  To prevent or treat constipation while you are taking prescription pain  medicine, your health care provider may recommend that you: ? Drink enough fluid to keep your urine clear or pale yellow. ? Take over-the-counter or prescription medicines. ? Eat foods that are high in fiber, such as fresh fruits and vegetables, whole grains, and beans. ? Limit foods that are high in fat and processed sugars, such as fried and sweet foods.  Take over-the-counter and prescription medicines only as told by your health care provider.  Do not take tub baths or go swimming until your health care provider approves.  Keep all follow-up visits as told by your health care provider. This is important. Contact a health care provider if:  You develop a rash.  You have more redness, swelling, or pain around your incision.  You have more fluid or blood coming from your incision.  Your incision feels warm to the touch.  You have pus or a bad smell coming from your incision.  You have a fever or chills.  You have blood in your stool (feces).  You have not had a bowel movement in 2-3 days.  Your pain is not controlled with medicine. Get help right away if:  You have chest pain or shortness of breath.  You feel light-headed or feel faint.  You have severe pain.  You vomit and your pain is worse. This information is not intended to replace advice given to you by your health care provider. Make sure you discuss any questions you have with your health care provider. Document Revised: 12/08/2016 Document Reviewed: 06/09/2015 Elsevier  Patient Education  2020 [place] Anesthesia, Adult, Care After This sheet gives you information about how to care for yourself after your procedure. Your health care provider may also give you more specific instructions. If you have problems or questions, contact your health care provider. What can I expect after the procedure? After the procedure, the following side effects are common:  Pain or discomfort at the IV  site.  Nausea.  Vomiting.  Sore throat.  Trouble concentrating.  Feeling cold or chills.  Weak or tired.  Sleepiness and fatigue.  Soreness and body aches. These side effects can affect parts of the body that were not involved in surgery. Follow these instructions at home:  For at least 24 hours after the procedure:  Have a responsible adult stay with you. It is important to have someone help care for you until you are awake and alert.  Rest as needed.  Do not: ? Participate in activities in which you could fall or become injured. ? Drive. ? Use heavy machinery. ? Drink alcohol. ? Take sleeping pills or medicines that cause drowsiness. ? Make important decisions or sign legal documents. ? Take care of children on your own. Eating and drinking  Follow any instructions from your health care provider about eating or drinking restrictions.  When you feel hungry, start by eating small amounts of foods that are soft and easy to digest (bland), such as toast. Gradually return to your regular diet.  Drink enough fluid to keep your urine pale yellow.  If you vomit, rehydrate by drinking water, juice, or clear broth. General instructions  If you have sleep apnea, surgery and certain medicines can increase your risk for breathing problems. Follow instructions from your health care provider about wearing your sleep device: ? Anytime you are sleeping, including during daytime naps. ? While taking prescription pain medicines, sleeping medicines, or medicines that make you drowsy.  Return to your normal activities as told by your health care provider. Ask your health care provider what activities are safe for you.  Take over-the-counter and prescription medicines only as told by your health care provider.  If you smoke, do not smoke without supervision.  Keep all follow-up visits as told by your health care provider. This is important. Contact a health care provider if:  You  have nausea or vomiting that does not get better with medicine.  You cannot eat or drink without vomiting.  You have pain that does not get better with medicine.  You are unable to pass urine.  You develop a skin rash.  You have a fever.  You have redness around your IV site that gets worse. Get help right away if:  You have difficulty breathing.  You have chest pain.  You have blood in your urine or stool, or you vomit blood. Summary  After the procedure, it is common to have a sore throat or nausea. It is also common to feel tired.  Have a responsible adult stay with you for the first 24 hours after general anesthesia. It is important to have someone help care for you until you are awake and alert.  When you feel hungry, start by eating small amounts of foods that are soft and easy to digest (bland), such as toast. Gradually return to your regular diet.  Drink enough fluid to keep your urine pale yellow.  Return to your normal activities as told by your health care provider. Ask your health care  provider what activities are safe for you. This information is not intended to replace advice given to you by your health care provider. Make sure you discuss any questions you have with your health care provider. Document Revised: 12/29/2016 Document Reviewed: 08/11/2016 Elsevier Patient Education  [place].

## 2019-04-11 ENCOUNTER — Telehealth: Payer: Self-pay | Admitting: General Surgery

## 2019-04-29 ENCOUNTER — Telehealth: Payer: Self-pay | Admitting: *Deleted

## 2019-04-29 NOTE — Telephone Encounter (Signed)
Per Eber Jones in endo: Can y'all please send letters to Ms. Kellison that their Covid test times have been changed.  [place]um[place] 4/30 @ 2:00   Letter sent.

## 2019-05-09 ENCOUNTER — Other Ambulatory Visit (HOSPITAL_COMMUNITY): Payer: BC Managed Care – PPO

## 2019-05-09 ENCOUNTER — Other Ambulatory Visit: Payer: Self-pay

## 2019-05-09 ENCOUNTER — Encounter (HOSPITAL_COMMUNITY): Admission: RE | Admit: 2019-05-09 | Payer: BC Managed Care – PPO | Source: Ambulatory Visit

## 2019-05-09 ENCOUNTER — Other Ambulatory Visit (HOSPITAL_COMMUNITY)
Admission: RE | Admit: 2019-05-09 | Discharge: 2019-05-09 | Disposition: A | Discharge status: Home / Self Care | Payer: BC Managed Care – PPO | Source: Ambulatory Visit | Attending: Internal Medicine | Admitting: Internal Medicine

## 2019-05-09 ENCOUNTER — Encounter (HOSPITAL_COMMUNITY)
Admission: RE | Admit: 2019-05-09 | Discharge: 2019-05-09 | Disposition: A | Discharge status: Home / Self Care | Payer: BC Managed Care – PPO | Source: Ambulatory Visit | Attending: Internal Medicine | Admitting: Internal Medicine

## 2019-05-09 NOTE — Patient Instructions (Signed)
Ashley Dickson  05/09/2019     @PREFPERIOPPHARMACY @   Your procedure is scheduled on  05/12/2019 .  Report to 07/12/2019 at  0815  A.M.  Call this number if you have problems the morning of surgery:  [phone]   Remember:  Follow the diet and prep instructions given to you by Dr [phone] office.                        Take these medicines the morning of surgery with A SIP OF WATER  Wellbutrin, lexapro, avapro, omeprazole.    Do not wear jewelry, make-up or nail polish.  Do not wear lotions, powders, or perfumes, or deodorant.  Do not shave 48 hours prior to surgery.  Men may shave face and neck.  Do not bring valuables to the hospital.  [facility] is not responsible for any belongings or valuables.  Contacts, dentures or bridgework may not be worn into surgery.  Leave your suitcase in the car.  After surgery it may be brought to your room.  For patients admitted to the hospital, discharge time will be determined by your treatment team.  Patients discharged the day of surgery will not be allowed to drive home.   Name and phone number of your driver:   family Special instructions:  None  Please read over the following fact sheets that you were given. Anesthesia Post-op Instructions and Care and Recovery After Surgery       Colonoscopy, Adult, Care After This sheet gives you information about how to care for yourself after your procedure. Your health care provider may also give you more specific instructions. If you have problems or questions, contact your health care provider. What can I expect after the procedure? After the procedure, it is common to have:  A small amount of blood in your stool for 24 hours after the procedure.  Some gas.  Mild cramping or bloating of your abdomen. Follow these instructions at home: Eating and drinking   Drink enough fluid to keep your urine pale yellow.  Follow instructions from your health care provider about eating  or drinking restrictions.  Resume your normal diet as instructed by your health care provider. Avoid heavy or fried foods that are hard to digest. Activity  Rest as told by your health care provider.  Avoid sitting for a long time without moving. Get up to take short walks every 1-2 hours. This is important to improve blood flow and breathing. Ask for help if you feel weak or unsteady.  Return to your normal activities as told by your health care provider. Ask your health care provider what activities are safe for you. Managing cramping and bloating   Try walking around when you have cramps or feel bloated.  Apply heat to your abdomen as told by your health care provider. Use the heat source that your health care provider recommends, such as a moist heat pack or a heating pad. ? Place a towel between your skin and the heat source. ? Leave the heat on for 20-30 minutes. ? Remove the heat if your skin turns bright red. This is especially important if you are unable to feel pain, heat, or cold. You may have a greater risk of getting burned. General instructions  For the first 24 hours after the procedure: ? Do not drive or use machinery. ? Do not sign important documents. ? Do not drink alcohol. ?  Do your regular daily activities at a slower pace than normal. ? Eat soft foods that are easy to digest.  Take over-the-counter and prescription medicines only as told by your health care provider.  Keep all follow-up visits as told by your health care provider. This is important. Contact a health care provider if:  You have blood in your stool 2-3 days after the procedure. Get help right away if you have:  More than a small spotting of blood in your stool.  Large blood clots in your stool.  Swelling of your abdomen.  Nausea or vomiting.  A fever.  Increasing pain in your abdomen that is not relieved with medicine. Summary  After the procedure, it is common to have a small  amount of blood in your stool. You may also have mild cramping and bloating of your abdomen.  For the first 24 hours after the procedure, do not drive or use machinery, sign important documents, or drink alcohol.  Get help right away if you have a lot of blood in your stool, nausea or vomiting, a fever, or increased pain in your abdomen. This information is not intended to replace advice given to you by your health care provider. Make sure you discuss any questions you have with your health care provider. Document Revised: 07/22/2018 Document Reviewed: 07/22/2018 Elsevier Patient Education  [place] After These instructions provide you with information about caring for yourself after your procedure. Your health care provider may also give you more specific instructions. Your treatment has been planned according to current medical practices, but problems sometimes occur. Call your health care provider if you have any problems or questions after your procedure. What can I expect after the procedure? After your procedure, you may:  Feel sleepy for several hours.  Feel clumsy and have poor balance for several hours.  Feel forgetful about what happened after the procedure.  Have poor judgment for several hours.  Feel nauseous or vomit.  Have a sore throat if you had a breathing tube during the procedure. Follow these instructions at home: For at least 24 hours after the procedure:      Have a responsible adult stay with you. It is important to have someone help care for you until you are awake and alert.  Rest as needed.  Do not: ? Participate in activities in which you could fall or become injured. ? Drive. ? Use heavy machinery. ? Drink alcohol. ? Take sleeping pills or medicines that cause drowsiness. ? Make important decisions or sign legal documents. ? Take care of children on your own. Eating and drinking  Follow the diet that is  recommended by your health care provider.  If you vomit, drink water, juice, or soup when you can drink without vomiting.  Make sure you have little or no nausea before eating solid foods. General instructions  Take over-the-counter and prescription medicines only as told by your health care provider.  If you have sleep apnea, surgery and certain medicines can increase your risk for breathing problems. Follow instructions from your health care provider about wearing your sleep device: ? Anytime you are sleeping, including during daytime naps. ? While taking prescription pain medicines, sleeping medicines, or medicines that make you drowsy.  If you smoke, do not smoke without supervision.  Keep all follow-up visits as told by your health care provider. This is important. Contact a health care provider if:  You keep feeling nauseous or you keep  vomiting.  You feel light-headed.  You develop a rash.  You have a fever. Get help right away if:  You have trouble breathing. Summary  For several hours after your procedure, you may feel sleepy and have poor judgment.  Have a responsible adult stay with you for at least 24 hours or until you are awake and alert. This information is not intended to replace advice given to you by your health care provider. Make sure you discuss any questions you have with your health care provider. Document Revised: 03/26/2017 Document Reviewed: 04/18/2015 Elsevier Patient Education  [place].

## 2019-05-09 NOTE — Pre-Procedure Instructions (Signed)
Have left multiple messages for patient to try and give her time for her procedure on Monday morning and have received no call back.

## 2019-05-12 ENCOUNTER — Ambulatory Visit (HOSPITAL_COMMUNITY)
Admission: RE | Admit: 2019-05-12 | Payer: BC Managed Care – PPO | Source: Ambulatory Visit | Admitting: Internal Medicine

## 2019-05-12 ENCOUNTER — Encounter (HOSPITAL_COMMUNITY): Admission: RE | Payer: Self-pay | Source: Ambulatory Visit

## 2019-05-12 SURGERY — COLONOSCOPY WITH PROPOFOL
Anesthesia: Monitor Anesthesia Care

## 2019-05-26 DIAGNOSIS — R1030 Lower abdominal pain, unspecified: Secondary | ICD-10-CM | POA: Diagnosis not present

## 2019-05-26 DIAGNOSIS — B373 Candidiasis of vulva and vagina: Secondary | ICD-10-CM | POA: Diagnosis not present

## 2019-06-17 DIAGNOSIS — E782 Mixed hyperlipidemia: Secondary | ICD-10-CM | POA: Diagnosis not present

## 2019-06-17 DIAGNOSIS — F331 Major depressive disorder, recurrent, moderate: Secondary | ICD-10-CM | POA: Diagnosis not present

## 2019-06-17 DIAGNOSIS — G4700 Insomnia, unspecified: Secondary | ICD-10-CM | POA: Diagnosis not present

## 2019-06-17 DIAGNOSIS — I10 Essential (primary) hypertension: Secondary | ICD-10-CM | POA: Diagnosis not present

## 2019-06-17 DIAGNOSIS — E1165 Type 2 diabetes mellitus with hyperglycemia: Secondary | ICD-10-CM | POA: Diagnosis not present

## 2019-06-19 DIAGNOSIS — E1165 Type 2 diabetes mellitus with hyperglycemia: Secondary | ICD-10-CM | POA: Diagnosis not present

## 2019-08-22 ENCOUNTER — Ambulatory Visit: Payer: BC Managed Care – PPO | Admitting: Gastroenterology

## 2019-08-22 ENCOUNTER — Encounter: Payer: Self-pay | Admitting: Internal Medicine

## 2019-10-17 DIAGNOSIS — M1990 Unspecified osteoarthritis, unspecified site: Secondary | ICD-10-CM | POA: Diagnosis not present

## 2019-10-17 DIAGNOSIS — E1065 Type 1 diabetes mellitus with hyperglycemia: Secondary | ICD-10-CM | POA: Diagnosis not present

## 2019-10-17 DIAGNOSIS — F331 Major depressive disorder, recurrent, moderate: Secondary | ICD-10-CM | POA: Diagnosis not present

## 2019-10-17 DIAGNOSIS — Z712 Person consulting for explanation of examination or test findings: Secondary | ICD-10-CM | POA: Diagnosis not present

## 2019-10-17 DIAGNOSIS — Z0189 Encounter for other specified special examinations: Secondary | ICD-10-CM | POA: Diagnosis not present

## 2019-10-17 DIAGNOSIS — G4700 Insomnia, unspecified: Secondary | ICD-10-CM | POA: Diagnosis not present

## 2019-10-17 DIAGNOSIS — E1165 Type 2 diabetes mellitus with hyperglycemia: Secondary | ICD-10-CM | POA: Diagnosis not present

## 2019-10-17 DIAGNOSIS — R1030 Lower abdominal pain, unspecified: Secondary | ICD-10-CM | POA: Diagnosis not present

## 2019-12-15 ENCOUNTER — Emergency Department
Admission: EM | Admit: 2019-12-15 | Discharge: 2019-12-15 | Disposition: A | Discharge status: Home / Self Care | Payer: BC Managed Care – PPO | Attending: Student in an Organized Health Care Education/Training Program | Admitting: Student in an Organized Health Care Education/Training Program

## 2019-12-15 ENCOUNTER — Other Ambulatory Visit: Payer: Self-pay

## 2019-12-15 ENCOUNTER — Emergency Department: Payer: BC Managed Care – PPO

## 2019-12-15 DIAGNOSIS — Z7984 Long term (current) use of oral hypoglycemic drugs: Secondary | ICD-10-CM | POA: Insufficient documentation

## 2019-12-15 DIAGNOSIS — R112 Nausea with vomiting, unspecified: Secondary | ICD-10-CM | POA: Diagnosis not present

## 2019-12-15 DIAGNOSIS — X58XXXA Exposure to other specified factors, initial encounter: Secondary | ICD-10-CM | POA: Insufficient documentation

## 2019-12-15 DIAGNOSIS — S29012A Strain of muscle and tendon of back wall of thorax, initial encounter: Secondary | ICD-10-CM | POA: Diagnosis not present

## 2019-12-15 DIAGNOSIS — R918 Other nonspecific abnormal finding of lung field: Secondary | ICD-10-CM | POA: Diagnosis not present

## 2019-12-15 DIAGNOSIS — I10 Essential (primary) hypertension: Secondary | ICD-10-CM | POA: Insufficient documentation

## 2019-12-15 DIAGNOSIS — R9431 Abnormal electrocardiogram [ECG] [EKG]: Secondary | ICD-10-CM | POA: Diagnosis not present

## 2019-12-15 DIAGNOSIS — T148XXA Other injury of unspecified body region, initial encounter: Secondary | ICD-10-CM

## 2019-12-15 DIAGNOSIS — E876 Hypokalemia: Secondary | ICD-10-CM | POA: Diagnosis not present

## 2019-12-15 DIAGNOSIS — M549 Dorsalgia, unspecified: Secondary | ICD-10-CM | POA: Diagnosis not present

## 2019-12-15 DIAGNOSIS — Z79899 Other long term (current) drug therapy: Secondary | ICD-10-CM | POA: Insufficient documentation

## 2019-12-15 DIAGNOSIS — S39012A Strain of muscle, fascia and tendon of lower back, initial encounter: Secondary | ICD-10-CM | POA: Diagnosis not present

## 2019-12-15 DIAGNOSIS — E1165 Type 2 diabetes mellitus with hyperglycemia: Secondary | ICD-10-CM

## 2019-12-15 LAB — BASIC METABOLIC PANEL
Anion gap: 18 — ABNORMAL HIGH (ref 5–15)
BUN: 36 mg/dL — ABNORMAL HIGH (ref 6–20)
CO2: 22 mmol/L (ref 22–32)
Calcium: 10.3 mg/dL (ref 8.9–10.3)
Chloride: 95 mmol/L — ABNORMAL LOW (ref 98–111)
Creatinine, Ser: 1.82 mg/dL — ABNORMAL HIGH (ref 0.44–1.00)
GFR, Estimated: 32 mL/min — ABNORMAL LOW (ref >60)
Glucose, Bld: 123 mg/dL — ABNORMAL HIGH (ref 70–99)
Potassium: 3.2 mmol/L — ABNORMAL LOW (ref 3.5–5.1)
Sodium: 135 mmol/L (ref 135–145)

## 2019-12-15 LAB — CBC WITH DIFFERENTIAL/PLATELET
Abs Immature Granulocytes: 0.04 10*3/uL (ref 0.00–0.07)
Basophils Absolute: 0.1 10*3/uL (ref 0.0–0.1)
Basophils Relative: 1 %
Eosinophils Absolute: 0.1 10*3/uL (ref 0.0–0.5)
Eosinophils Relative: 1 %
HCT: 45.5 % (ref 36.0–46.0)
Hemoglobin: 16.1 g/dL — ABNORMAL HIGH (ref 12.0–15.0)
Immature Granulocytes: 0 %
Lymphocytes Relative: 31 %
Lymphs Abs: 3.9 10*3/uL (ref 0.7–4.0)
MCH: 31.4 pg (ref 26.0–34.0)
MCHC: 35.4 g/dL (ref 30.0–36.0)
MCV: 88.7 fL (ref 80.0–100.0)
Monocytes Absolute: 0.9 10*3/uL (ref 0.1–1.0)
Monocytes Relative: 7 %
Neutro Abs: 7.7 10*3/uL (ref 1.7–7.7)
Neutrophils Relative %: 60 %
Platelets: 350 10*3/uL (ref 150–400)
RBC: 5.13 MIL/uL — ABNORMAL HIGH (ref 3.87–5.11)
RDW: 12.7 % (ref 11.5–15.5)
WBC: 12.8 10*3/uL — ABNORMAL HIGH (ref 4.0–10.5)
nRBC: 0 % (ref 0.0–0.2)

## 2019-12-15 LAB — TROPONIN I (HIGH SENSITIVITY): Troponin I (High Sensitivity): 3 ng/L (ref <18)

## 2019-12-15 LAB — CBG MONITORING, ED: Glucose-Capillary: 118 mg/dL — ABNORMAL HIGH (ref 70–99)

## 2019-12-15 MED ORDER — KETOROLAC TROMETHAMINE 60 MG/2ML IM SOLN
30.0000 mg | Freq: Once | INTRAMUSCULAR | Status: AC
  Administered 2019-12-15: 30 mg via INTRAMUSCULAR
  Filled 2019-12-15: qty 2

## 2019-12-15 MED ORDER — LACTATED RINGERS IV BOLUS
1000.0000 mL | Freq: Once | INTRAVENOUS | Status: AC
  Administered 2019-12-15: 1000 mL via INTRAVENOUS

## 2019-12-15 MED ORDER — ONDANSETRON 4 MG PO TBDP
4.0000 mg | ORAL_TABLET | Freq: Once | ORAL | Status: AC
  Administered 2019-12-15: 4 mg via ORAL
  Filled 2019-12-15: qty 1

## 2019-12-15 MED ORDER — POTASSIUM CHLORIDE CRYS ER 20 MEQ PO TBCR
40.0000 meq | EXTENDED_RELEASE_TABLET | Freq: Once | ORAL | Status: AC
  Administered 2019-12-15: 40 meq via ORAL
  Filled 2019-12-15: qty 2

## 2019-12-15 NOTE — Discharge Instructions (Signed)
Take Naprosyn or Tylenol for pain.  Follow up with your PCP next week for repeat labs or sooner if not feeling well.  Return to the ER for symptoms that change or worsen if unable to schedule an appointment.

## 2019-12-15 NOTE — ED Notes (Signed)
Prior RN stuck twice without success, this RN attempted x1 without success. Will inform PA and place order for IV team if needed

## 2019-12-15 NOTE — ED Provider Notes (Signed)
[facility] Emergency Department Provider Note ____________________________________________  Time seen: Approximately 3:03 PM  I have reviewed the triage vital signs and the nursing notes.   HISTORY  Chief Complaint Back Pain    HPI Ashley Dickson is a 59 y.o. female who presents to the emergency department for evaluation and treatment of upper back pain without trauma. 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 Pain is worse with movement. No alleviating measures prior to arrival.   Past Medical History:  Diagnosis Date  . Anxiety   . Arthritis   . Diabetes mellitus without complication (HCC)   . Gastroparesis   . GERD (gastroesophageal reflux disease)   . H pylori ulcer   . History of kidney stones   . Hypercholesteremia   . Migraines   . Renal disorder   . Shingles   . Sleep apnea    not using CPAP.    Patient Active Problem List   Diagnosis Date Noted  . Incisional hernia, without obstruction or gangrene   . Early satiety 02/20/2019  . Colon cancer screening 02/20/2019  . Supraumbilical hernia 02/20/2019  . Nausea with vomiting 12/12/2018  . S/P left knee arthroscopy 11/04/2018 11/07/2018  . Derangement of posterior horn of medial meniscus of left knee   . Chondromalacia of medial femoral condyle, left   . Chondromalacia patellae, left knee   . Constipation 10/16/2018  . History of Helicobacter pylori infection 10/16/2018  . Need for hepatitis C screening test 10/16/2018  . Gastroesophageal reflux disease 02/24/2015  . Fatty liver 02/19/2015  . Controlled type 2 diabetes mellitus without complication (HCC) 01/21/2015  . Anxiety attack 12/17/2013  . Gonalgia 09/30/2013  . Allergic rhinitis 08/30/2012  . Headache, migraine 08/30/2012  . Menopausal and perimenopausal disorder 08/30/2012  . Cannot sleep 03/12/2012  . BP (high blood pressure) 02/12/2012   . Clinical depression 01/12/2012    Past Surgical History:  Procedure Laterality Date  . CHOLECYSTECTOMY    . CHONDROPLASTY Left 10/31/2018   Procedure: CHONDROPLASTY MEDIAL FEMORAL CONDYLE AND PATELLA;  Surgeon: Vickki Hearing, MD;  Location: [facility];  Service: Orthopedics;  Laterality: Left;  . CYSTOSCOPY     with stone extraction-multiple  . ENDOMETRIAL ABLATION    . ESOPHAGOGASTRODUODENOSCOPY (EGD) WITH PROPOFOL N/A 11/21/2018   Procedure: ESOPHAGOGASTRODUODENOSCOPY (EGD) WITH PROPOFOL;  Surgeon: Corbin Ade, MD;  Incomplete due to large amount of retained gastric contents.   . ESOPHAGOGASTRODUODENOSCOPY (EGD) WITH PROPOFOL N/A 12/19/2018   Procedure: ESOPHAGOGASTRODUODENOSCOPY (EGD) WITH PROPOFOL;  Surgeon: Corbin Ade, MD; normal esophagus, normal stomach s/p biopsy, normal duodenal bulb and second portion of the duodenum.  Pathology with benign gastric mucosa.  No H. Pylori.    . excision of bone spur Bilateral    heels  . INCISIONAL HERNIA REPAIR N/A 04/04/2019   Procedure: HERNIA REPAIR INCISIONAL WITH MESH;  Surgeon: Franky Macho, MD;  Location: [facility];  Service: General;  Laterality: N/A;  . KNEE ARTHROSCOPY WITH LATERAL MENISECTOMY Left 10/31/2018   Procedure: LEFT KNEE ARTHROSCOPY WITH MEDIAL MENISCECTOMY;  Surgeon: Vickki Hearing, MD;  Location: [facility];  Service: Orthopedics;  Laterality: Left;  . TUBAL LIGATION      Prior to Admission medications   Medication Sig Start Date End Date Taking? Authorizing Provider  amitriptyline (ELAVIL) 25 MG tablet Take 25 mg by mouth at bedtime as needed for sleep.  01/08/19   [provider]  atorvastatin (LIPITOR) 40 MG tablet Take 40 mg by mouth daily.  01/21/15 03/28/19  [provider]  buPROPion (WELLBUTRIN XL) 300 MG 24 hr tablet Take 300 mg by mouth daily.    [provider]  cetirizine (ZYRTEC) 10 MG tablet Take 10 mg by mouth daily.    [provider]  chlorthalidone  (HYGROTON) 25 MG tablet Take 25 mg by mouth daily.    [provider]  dapagliflozin propanediol (FARXIGA) 5 MG TABS tablet Take 5 mg by mouth daily.    [provider]  escitalopram (LEXAPRO) 10 MG tablet Take 10 mg by mouth daily.     [provider]  glipiZIDE (GLUCOTROL) 10 MG tablet Take 10 mg by mouth daily. 09/17/18   [provider]  HYDROcodone-acetaminophen (NORCO) 5-325 MG tablet Take 1 tablet by mouth every 4 (four) hours as needed for moderate pain. 04/04/19   Franky Macho, MD  irbesartan (AVAPRO) 75 MG tablet Take 75 mg by mouth every evening.     [provider]  meloxicam (MOBIC) 15 MG tablet Take 15 mg by mouth daily. 04/24/19   [provider]  metFORMIN (GLUCOPHAGE) 1000 MG tablet Take 2,000 mg by mouth 2 (two) times daily with a meal.  12/12/18   [provider]  omeprazole (PRILOSEC) 40 MG capsule Take 1 capsule (40 mg total) by mouth 2 (two) times daily. 02/20/19   Letta Median, PA-C  ondansetron (ZOFRAN) 4 MG tablet Take 1 tablet (4 mg total) by mouth every 8 (eight) hours as needed for nausea or vomiting. 04/04/19   Franky Macho, MD  polyethylene glycol-electrolytes (TRILYTE) 420 g solution Take 4,000 mLs by mouth as directed. 02/20/19   Rourk, Gerrit Friends, MD    Allergies Azithromycin, Tramadol, Ace inhibitors, Sumatriptan, and Voltaren [diclofenac]  Family History  Problem Relation Age of Onset  . Diabetes Father   . Emphysema Father   . Colon cancer Neg Hx   . Colon polyps Neg Hx     Social History Social History   Tobacco Use  . Smoking status: Never Smoker  . Smokeless tobacco: Never Used  Vaping Use  . Vaping Use: Never used  Substance Use Topics  . Alcohol use: Yes    Comment: rarely  . Drug use: Not Currently    Types: Cocaine    Comment: Last used 20 years ago.     Review of Systems Constitutional: Negative for fever. Cardiovascular: Negative for chest pain. Respiratory: Negative for  shortness of breath. Musculoskeletal: Positive for mid back pain. Skin: Negative for rash or wounds.  Neurological: Negative for decrease in sensation  ____________________________________________   PHYSICAL EXAM:  VITAL SIGNS: ED Triage Vitals  Enc Vitals Group     BP 12/15/19 1351 129/87     Pulse Rate 12/15/19 1351 (!) 102     Resp 12/15/19 1351 18     Temp 12/15/19 1351 98.5 F (36.9 C)     Temp Source 12/15/19 1351 Oral     SpO2 12/15/19 1351 100 %     Weight 12/15/19 1352 190 lb (86.2 kg)     Height 12/15/19 1352 5\' 7"  (1.702 m)     Head Circumference --      Peak Flow --      Pain Score 12/15/19 1351 7     Pain Loc --      Pain Edu? --      Excl. in GC? --     Constitutional:  Alert and oriented. Well appearing and in no acute distress. Eyes: Conjunctivae are clear without discharge or drainage Head: Atraumatic Neck: Supple Cardiovascular: Heart rate regular Respiratory: No cough. Respirations are even and unlabored. Musculoskeletal: Transverse, non-focal thoracic back pain Neurologic: Awake, alert, oriented  Skin: No open wounds or lesions on back.  Psychiatric: Affect and behavior are appropriate.  ____________________________________________   LABS (all labs ordered are listed, but only abnormal results are displayed)  Labs Reviewed  BASIC METABOLIC PANEL - Abnormal; Notable for the following components:      Result Value   Potassium 3.2 (*)    Chloride 95 (*)    Glucose, Bld 123 (*)    BUN 36 (*)    Creatinine, Ser 1.82 (*)    GFR, Estimated 32 (*)    Anion gap 18 (*)    All other components within normal limits  CBC WITH DIFFERENTIAL/PLATELET - Abnormal; Notable for the following components:   WBC 12.8 (*)    RBC 5.13 (*)    Hemoglobin 16.1 (*)    All other components within normal limits  CBG MONITORING, ED - Abnormal; Notable for the following components:   Glucose-Capillary 118 (*)    All other components within normal limits  TROPONIN I  (HIGH SENSITIVITY)   ____________________________________________  RADIOLOGY  Not indicated.   I, Kem Boroughsari Afnan Cadiente, personally viewed and evaluated these images (plain radiographs) as part of my medical decision making, as well as reviewing the written report by the radiologist.  DG Chest 1 View  Result Date: 12/15/2019 CLINICAL DATA:  sudden onset back pain EXAM: CHEST  1 VIEW COMPARISON:  11/27/2013 chest radiograph and prior. FINDINGS: No pneumothorax or pleural effusion. Cardiomediastinal silhouette within normal limits. Scattered patchy basilar opacities. No acute osseous abnormality. IMPRESSION: Scattered basilar opacities, likely atelectasis. Electronically Signed   By: Stana Buntinghikanele  Emekauwa M.D.   On: 12/15/2019 16:15   ____________________________________________   PROCEDURES  Procedures  ED ECG REPORT I, Breniya Goertzen, FNP-BC personally viewed and interpreted this ECG.   Date: 12/15/2019  EKG Time: 1529  Rate: 79  Rhythm: normal sinus rhythm  Axis: normal  Intervals:none  ST&T Change: no ST elevation  ____________________________________________   INITIAL IMPRESSION / ASSESSMENT AND PLAN / ED COURSE  Dellis AnesSheryl R Flam is a 59 y.o. who presents to the emergency department for evaluation of mid back pain. See HPI for further details. Although likely musculoskeletal, because the pain started acutely without injury and she is diabetic, will get some screening labs, ekg, and chest x-ray. Patient aware and agreeable to the plan.  ----------------------------------------- 4:14 PM on 12/15/2019 -----------------------------------------  Labs reviewed. Mild hypokalemia at 3.2--potassium ordered. BUN and creatinine elevated at 36/1.82 with a GFR of 32. Anion gap increased to 18. Will order fluids and repeat BMP. Troponin is normal at 3.  ----------------------------------------- 4:24 PM on 12/15/2019 -----------------------------------------  Chest x-ray without acute  findings.  ----------------------------------------- 6:51 PM on 12/15/2019 -----------------------------------------  Discussed follow up with PCP next week for repeat labs. Patient reassured after work up. Will discharge her home. ER return precautions discussed.  Medications  ondansetron (ZOFRAN-ODT) disintegrating tablet 4 mg (4 mg Oral Given 12/15/19 1543)  ketorolac (TORADOL) injection 30 mg (30 mg Intramuscular Given 12/15/19 1543)  lactated ringers bolus 1,000 mL (0 mLs Intravenous Stopped 12/15/19 1852)  potassium chloride SA (KLOR-CON) CR tablet 40 mEq (40 mEq Oral Given 12/15/19 1618)    Pertinent labs & imaging results that were available during my care of the patient were  reviewed by me and considered in my medical decision making (see chart for details).   _________________________________________   FINAL CLINICAL IMPRESSION(S) / ED DIAGNOSES  Final diagnoses:  Musculoskeletal strain  Hyperglycemia due to diabetes mellitus (HCC)  Hypokalemia    ED Discharge Orders    None       If controlled substance prescribed during this visit, 12 month history viewed on the [facility] prior to issuing an initial prescription for Schedule II or III opiod.   Chinita Pester, FNP 12/15/19 1859    Willy Eddy, MD 12/15/19 1902

## 2019-12-15 NOTE — ED Notes (Signed)
Repeat trop not needed per MD

## 2019-12-15 NOTE — ED Triage Notes (Signed)
Pt here with pain between her shoulder blades that travels down her right arm and up her neck. Pt states that the pain started this morning. Pt NAD in triage.

## 2019-12-24 DIAGNOSIS — M9902 Segmental and somatic dysfunction of thoracic region: Secondary | ICD-10-CM | POA: Diagnosis not present

## 2019-12-24 DIAGNOSIS — M1990 Unspecified osteoarthritis, unspecified site: Secondary | ICD-10-CM | POA: Diagnosis not present

## 2019-12-24 DIAGNOSIS — M546 Pain in thoracic spine: Secondary | ICD-10-CM | POA: Diagnosis not present

## 2019-12-24 DIAGNOSIS — R1030 Lower abdominal pain, unspecified: Secondary | ICD-10-CM | POA: Diagnosis not present

## 2019-12-24 DIAGNOSIS — F331 Major depressive disorder, recurrent, moderate: Secondary | ICD-10-CM | POA: Diagnosis not present

## 2019-12-24 DIAGNOSIS — G4700 Insomnia, unspecified: Secondary | ICD-10-CM | POA: Diagnosis not present

## 2019-12-26 DIAGNOSIS — M546 Pain in thoracic spine: Secondary | ICD-10-CM | POA: Diagnosis not present

## 2019-12-26 DIAGNOSIS — M9902 Segmental and somatic dysfunction of thoracic region: Secondary | ICD-10-CM | POA: Diagnosis not present

## 2019-12-29 DIAGNOSIS — M546 Pain in thoracic spine: Secondary | ICD-10-CM | POA: Diagnosis not present

## 2019-12-29 DIAGNOSIS — M9902 Segmental and somatic dysfunction of thoracic region: Secondary | ICD-10-CM | POA: Diagnosis not present

## 2019-12-31 DIAGNOSIS — M546 Pain in thoracic spine: Secondary | ICD-10-CM | POA: Diagnosis not present

## 2019-12-31 DIAGNOSIS — M9902 Segmental and somatic dysfunction of thoracic region: Secondary | ICD-10-CM | POA: Diagnosis not present

## 2020-01-07 DIAGNOSIS — M546 Pain in thoracic spine: Secondary | ICD-10-CM | POA: Diagnosis not present

## 2020-01-07 DIAGNOSIS — M9902 Segmental and somatic dysfunction of thoracic region: Secondary | ICD-10-CM | POA: Diagnosis not present

## 2020-01-16 DIAGNOSIS — M9902 Segmental and somatic dysfunction of thoracic region: Secondary | ICD-10-CM | POA: Diagnosis not present

## 2020-01-16 DIAGNOSIS — M546 Pain in thoracic spine: Secondary | ICD-10-CM | POA: Diagnosis not present

## 2020-01-20 ENCOUNTER — Other Ambulatory Visit (HOSPITAL_COMMUNITY): Payer: Self-pay

## 2020-02-02 DIAGNOSIS — M9902 Segmental and somatic dysfunction of thoracic region: Secondary | ICD-10-CM | POA: Diagnosis not present

## 2020-02-02 DIAGNOSIS — M546 Pain in thoracic spine: Secondary | ICD-10-CM | POA: Diagnosis not present

## 2020-02-04 DIAGNOSIS — M9902 Segmental and somatic dysfunction of thoracic region: Secondary | ICD-10-CM | POA: Diagnosis not present

## 2020-02-04 DIAGNOSIS — M546 Pain in thoracic spine: Secondary | ICD-10-CM | POA: Diagnosis not present

## 2020-02-09 DIAGNOSIS — M546 Pain in thoracic spine: Secondary | ICD-10-CM | POA: Diagnosis not present

## 2020-02-09 DIAGNOSIS — M9902 Segmental and somatic dysfunction of thoracic region: Secondary | ICD-10-CM | POA: Diagnosis not present

## 2020-02-11 DIAGNOSIS — M546 Pain in thoracic spine: Secondary | ICD-10-CM | POA: Diagnosis not present

## 2020-02-11 DIAGNOSIS — M9902 Segmental and somatic dysfunction of thoracic region: Secondary | ICD-10-CM | POA: Diagnosis not present

## 2020-02-13 DIAGNOSIS — M546 Pain in thoracic spine: Secondary | ICD-10-CM | POA: Diagnosis not present

## 2020-02-13 DIAGNOSIS — M9902 Segmental and somatic dysfunction of thoracic region: Secondary | ICD-10-CM | POA: Diagnosis not present

## 2020-02-20 DIAGNOSIS — M9902 Segmental and somatic dysfunction of thoracic region: Secondary | ICD-10-CM | POA: Diagnosis not present

## 2020-02-20 DIAGNOSIS — M546 Pain in thoracic spine: Secondary | ICD-10-CM | POA: Diagnosis not present

## 2020-03-01 DIAGNOSIS — M546 Pain in thoracic spine: Secondary | ICD-10-CM | POA: Diagnosis not present

## 2020-03-01 DIAGNOSIS — M9902 Segmental and somatic dysfunction of thoracic region: Secondary | ICD-10-CM | POA: Diagnosis not present

## 2020-03-05 DIAGNOSIS — M546 Pain in thoracic spine: Secondary | ICD-10-CM | POA: Diagnosis not present

## 2020-03-05 DIAGNOSIS — M9902 Segmental and somatic dysfunction of thoracic region: Secondary | ICD-10-CM | POA: Diagnosis not present

## 2020-03-12 ENCOUNTER — Other Ambulatory Visit: Payer: Self-pay | Admitting: Gastroenterology

## 2020-03-12 DIAGNOSIS — K219 Gastro-esophageal reflux disease without esophagitis: Secondary | ICD-10-CM

## 2020-12-05 IMAGING — CT CT ABDOMEN AND PELVIS WITH CONTRAST
2 of 5 series · 15 of 46 positions shown, 17 images · IV contrast (Isovue)
Comparison: None.

CLINICAL DATA: Right upper flank pain.

EXAM:
CT ABDOMEN AND PELVIS WITH CONTRAST
TECHNIQUE: Multidetector CT imaging of the abdomen and pelvis was performed
using the standard protocol following bolus administration of
intravenous contrast.
CONTRAST:  80mL OMNIPAQUE IOHEXOL 300 MG/ML  SOLN

[Series 2: axial st · axial · 0.86mm/px · z∈[-318,+52]mm · 12 of 88 slices shown, 14 images]
[im 7/88  soft-tissue]
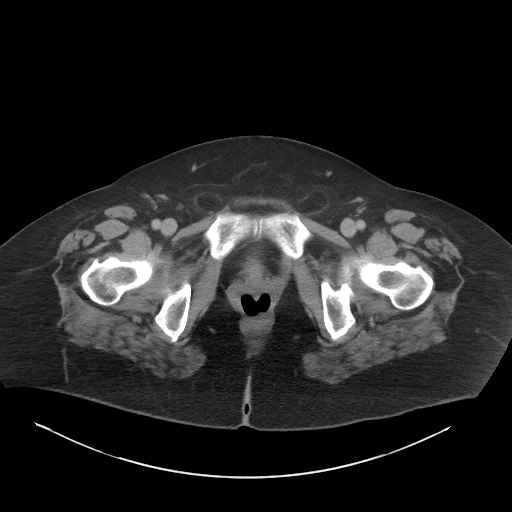
[im 7/88  bone]
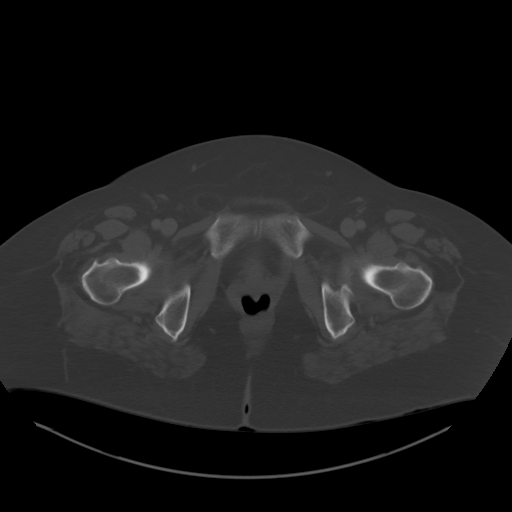
[im 14/88  soft-tissue]
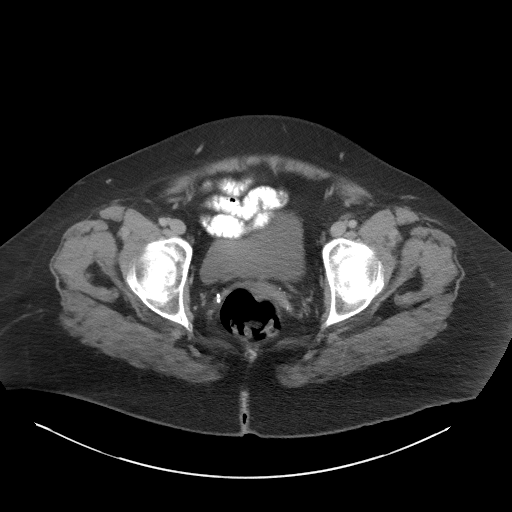
[im 21/88  soft-tissue]
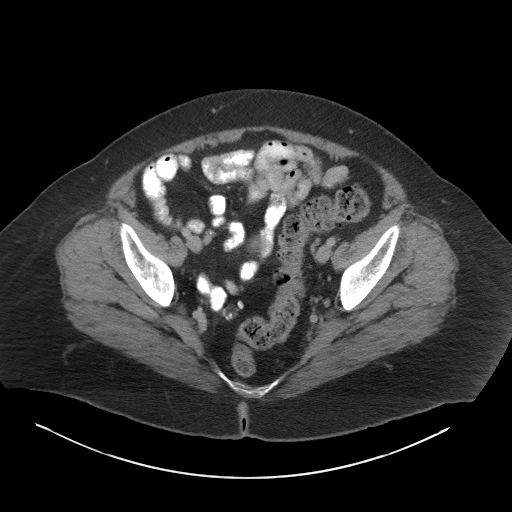
[im 27/88  soft-tissue]
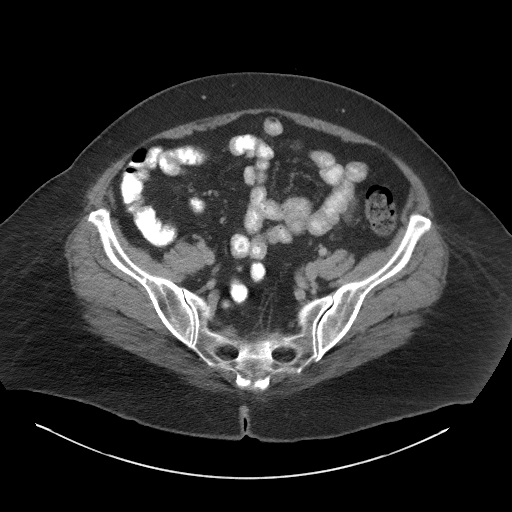
[im 34/88  soft-tissue]
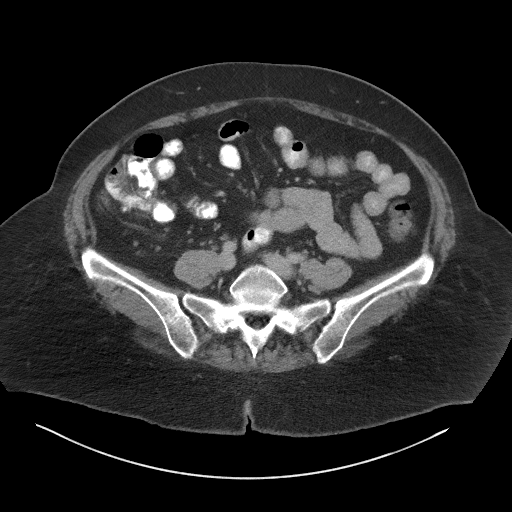
[im 41/88  soft-tissue]
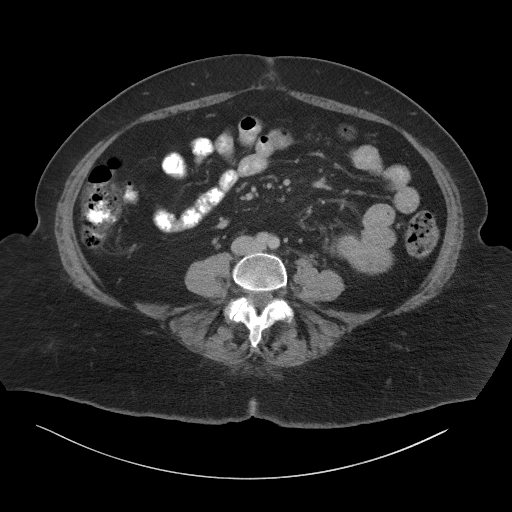
[im 47/88  soft-tissue]
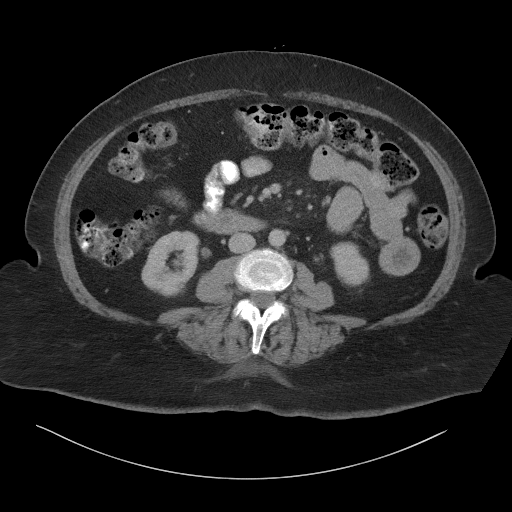
[im 54/88  soft-tissue]
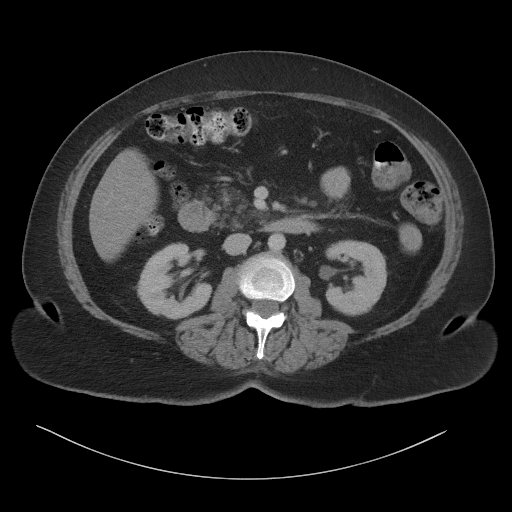
[im 61/88  soft-tissue]
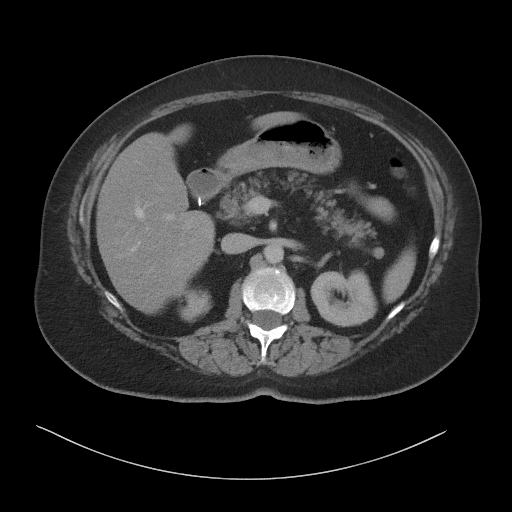
[im 61/88  bone]
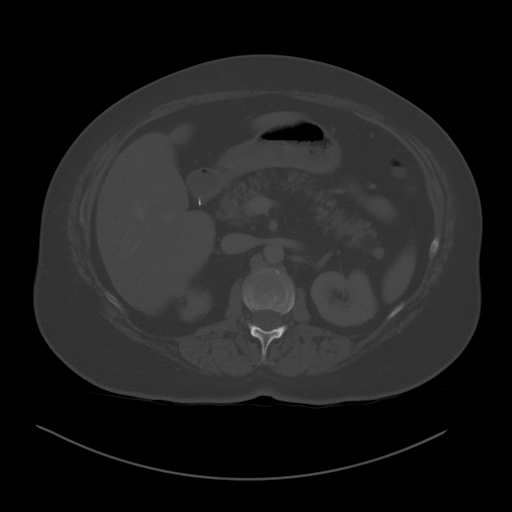
[im 67/88  soft-tissue]
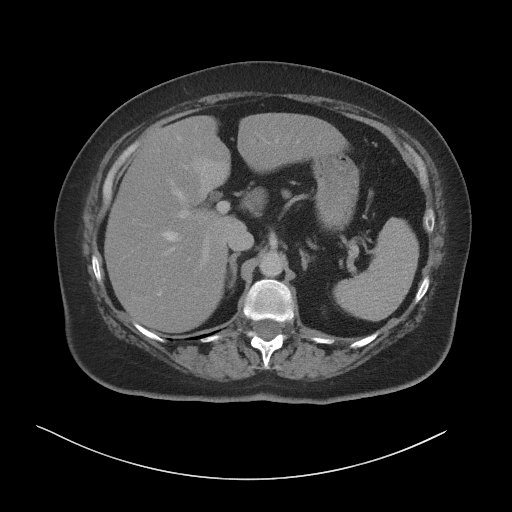
[im 74/88  soft-tissue]
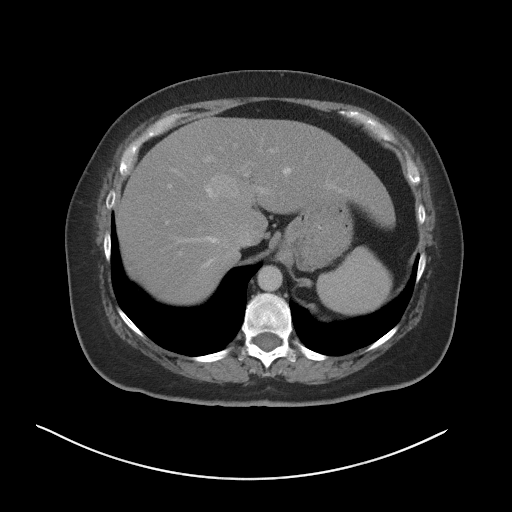
[im 81/88  soft-tissue]
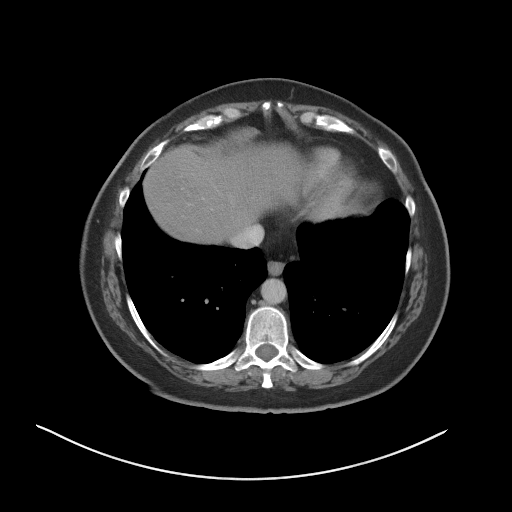

[Series 5: coronal st · coronal · 0.87mm/px · 3 of 119 slices shown]
[im 40/119  soft-tissue]
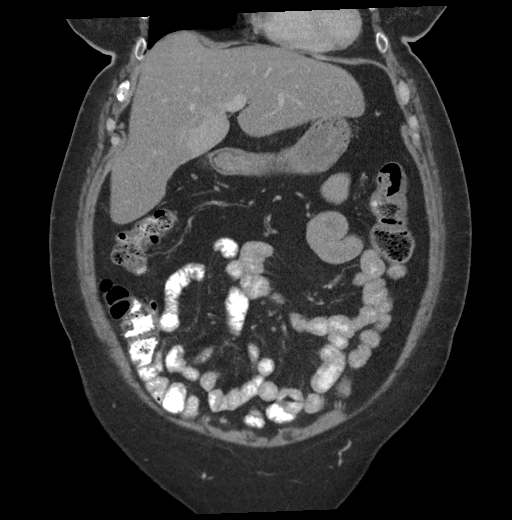
[im 53/119  soft-tissue]
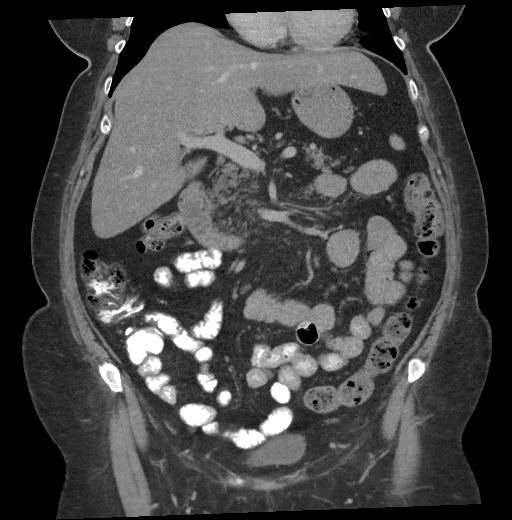
[im 66/119  soft-tissue]
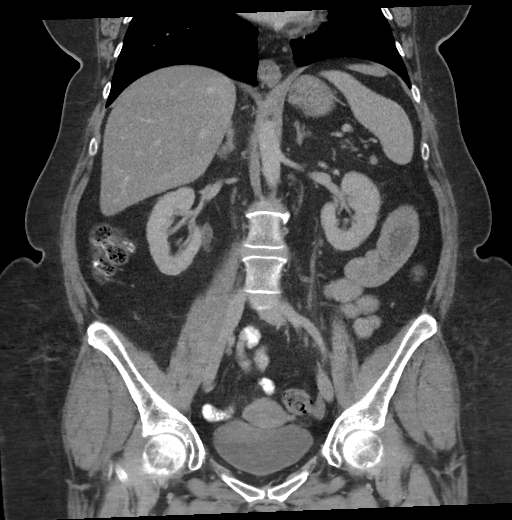

[15 of 46 positions shown; findings below may reference images not displayed]

FINDINGS: Lower chest: No acute abnormality.

Hepatobiliary: There is hepatic steatosis. There is a geographic
area of focal fatty sparing within segment 4 which extends adjacent
to the falciform ligament. No suspicious liver lesions identified.
Previous cholecystectomy. The CBD measures 8 mm in maximum diameter.

Pancreas: Unremarkable. No pancreatic ductal dilatation or
surrounding inflammatory changes.

Spleen: Normal in size without focal abnormality.

Adrenals/Urinary Tract: Normal appearance of the adrenal glands.
Lower pole right renal calculi measures 7 mm, image 73/5. Punctate
stone within the inferior pole of left kidney measures 3 mm. No
hydronephrosis or mass identified bilaterally. No hydroureter or
ureteral lithiasis identified. The urinary bladder appears normal.

Stomach/Bowel: Stomach is unremarkable. The small bowel loops have a
normal caliber. No evidence for bowel obstruction. Unremarkable
appearance of the colon.

Vascular/Lymphatic: Normal appearance of the abdominal aorta. No
aneurysm. No enlarged abdominal or pelvic lymph nodes. No inguinal
adenopathy.

Reproductive: Uterus and bilateral adnexa are unremarkable.

Other: No free fluid or fluid collections within the abdomen or
pelvis. Fat containing supraumbilical hernia measures 2.1 cm. There
is also a small fat containing umbilical hernia. Bilateral inguinal
hernias are also noted containing fat only.

Musculoskeletal: No acute or significant osseous findings.
IMPRESSION: 1. No acute findings.
2. Bilateral lower pole renal calculi without hydronephrosis.
3. Hepatic steatosis.
4. Mild increase caliber of the CBD status post cholecystectomy.

## 2021-02-15 IMAGING — MR MR KNEE*L* W/O CM
4 of 7 series · 13 of 40 positions shown · non-contrast
Comparison: None.

CLINICAL DATA: Knee pain, persistent

EXAM:
MRI OF THE LEFT KNEE WITHOUT CONTRAST
TECHNIQUE: Multiplanar, multisequence MR imaging of the knee was performed. No
intravenous contrast was administered.

[Series 3: T2 fat-sat · axial · 4.0mm · 0.22mm/px · z∈[-38,+77]mm · 3 of 24 slices shown]
[im 1/24]
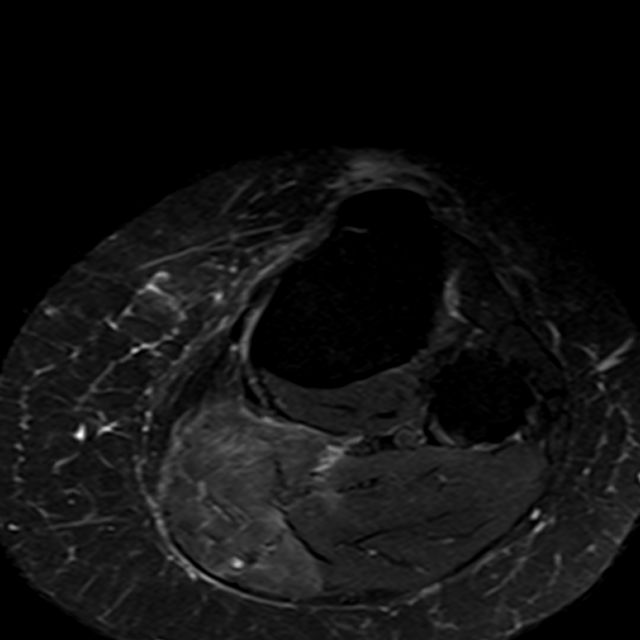
[im 12/24]
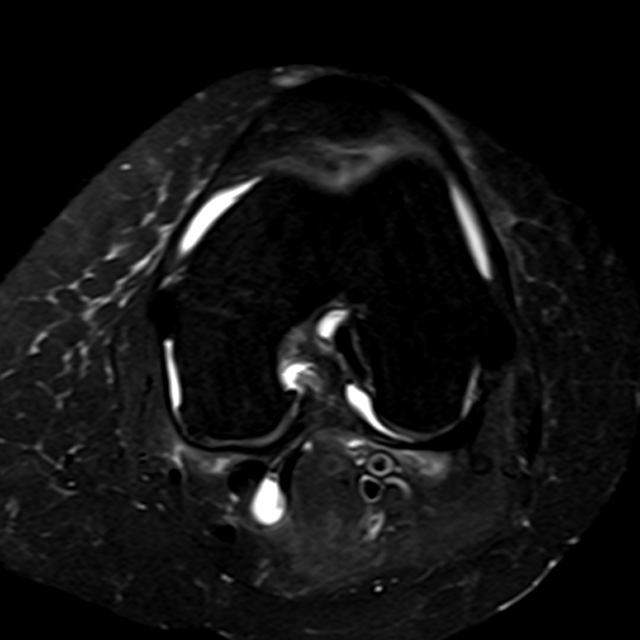
[im 24/24]
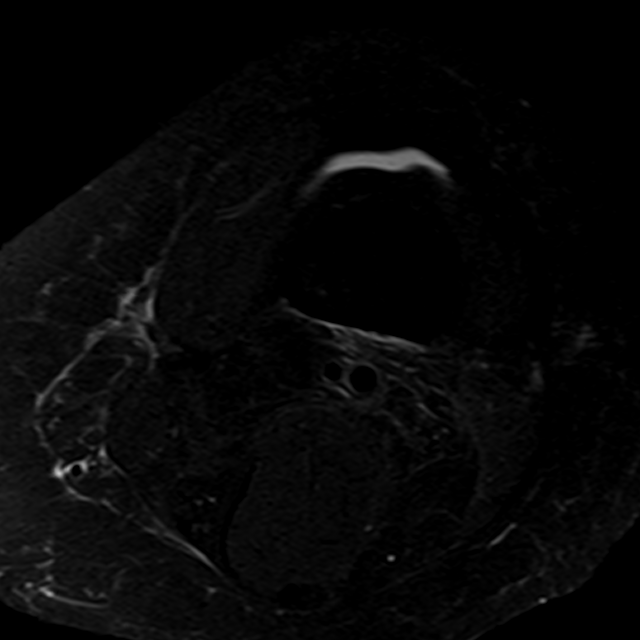

[Series 6: PD fat-sat · coronal · 3.0mm · 0.20mm/px · 4 of 36 slices shown (1 of 3)]
[im 1/36]
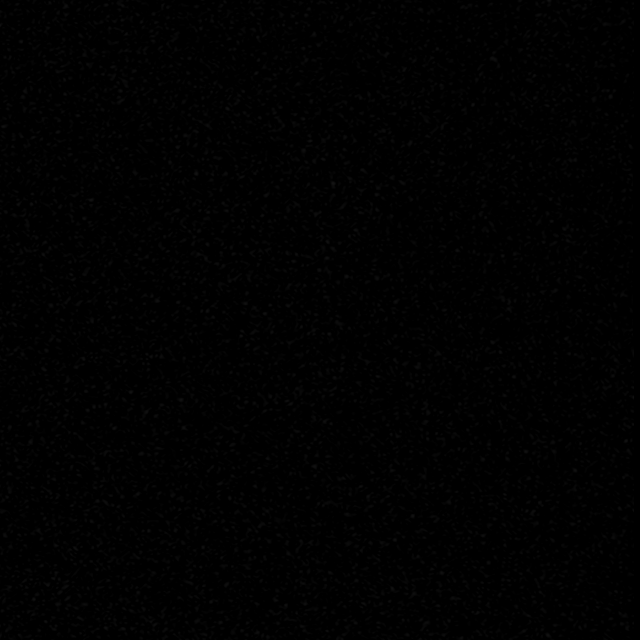
[im 6/36]
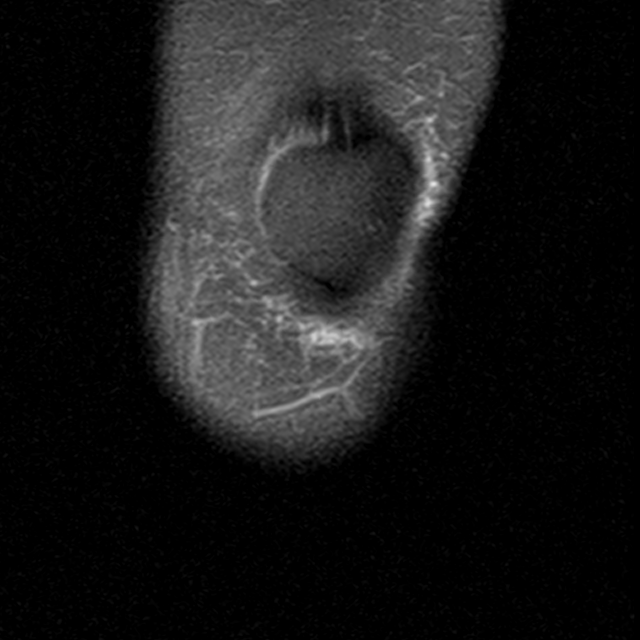
[im 21/36]
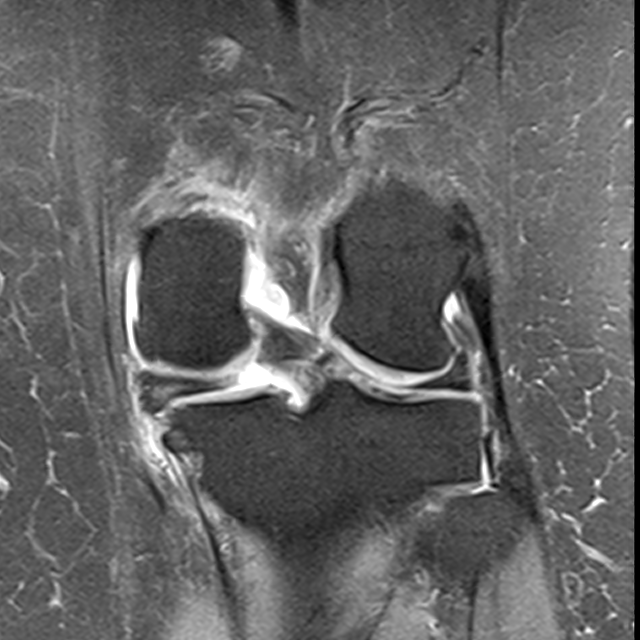
[im 31/36]
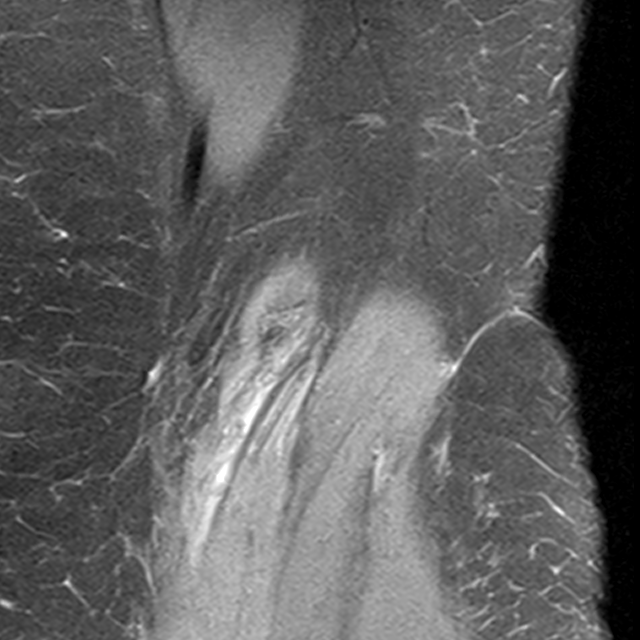

[Series 7: PD fat-sat · sagittal · 3.0mm · 0.23mm/px · 3 of 29 slices shown (2 of 3)]
[im 6/29]
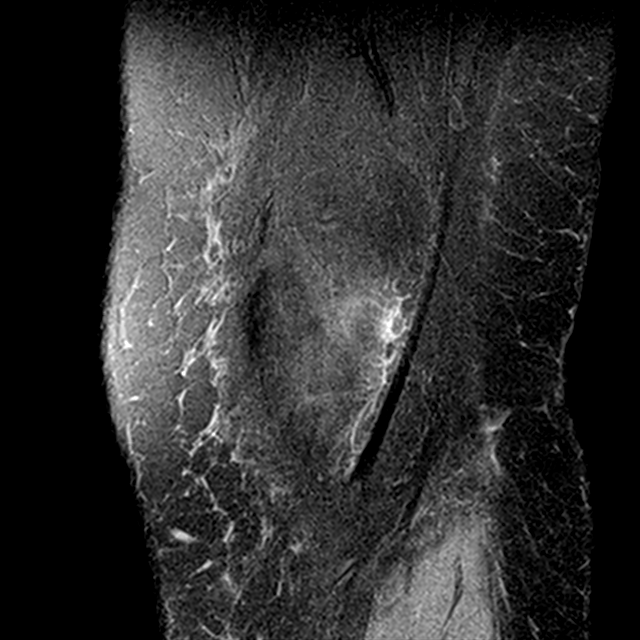
[im 17/29]
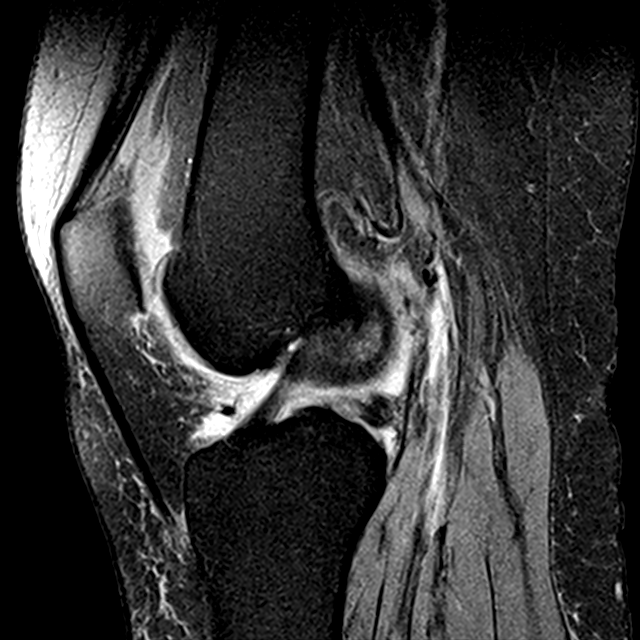
[im 29/29]
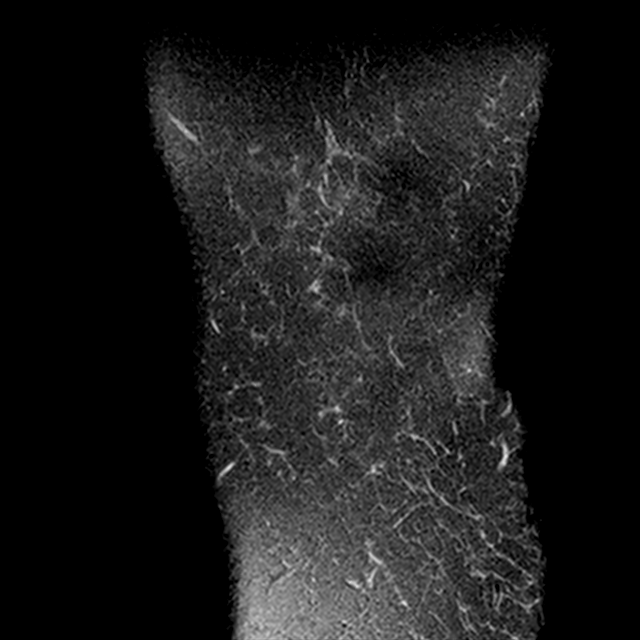

[Series 9: PD fat-sat · coronal · 2.0mm · 0.20mm/px · 3 of 15 slices shown (3 of 3)]
[im 1/15]
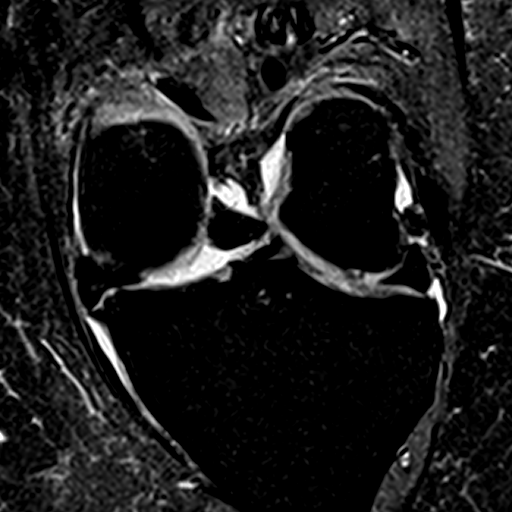
[im 8/15]
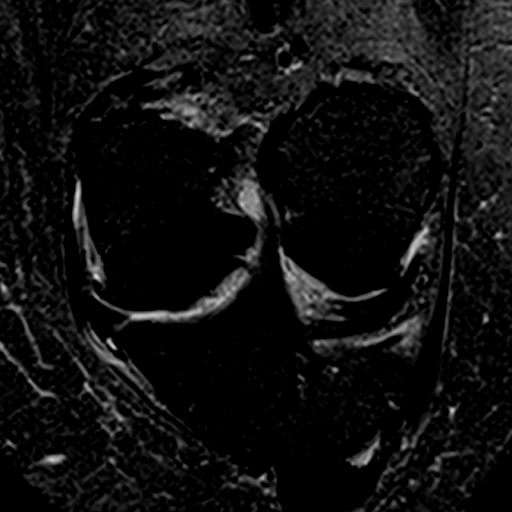
[im 15/15]
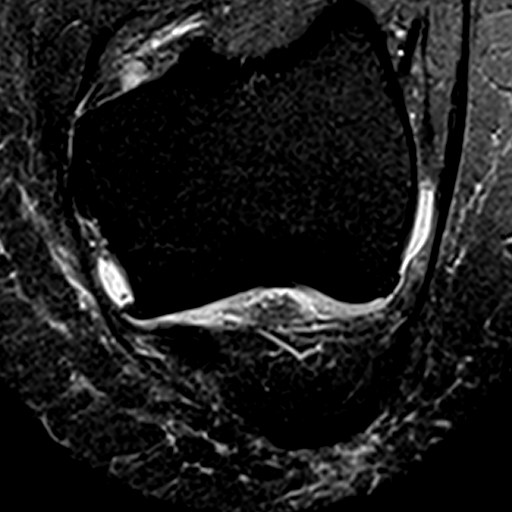

[13 of 40 positions shown; findings below may reference images not displayed]

FINDINGS: MENISCI

Medial: There is a complex tear seen of the posterior horn root
junction with only a small amount of posterior root still intact.
There is slight extrusion of the mid body.

Lateral: There is a nondisplaced tear seen of the anterior horn root
junction.

LIGAMENTS

Cruciates: The ACL is intact. The PCL is intact.

Collaterals: There is increased signal seen around the deep fibers
of the MCL, however it is intact. The lateral collateral ligamentous
complex is intact.

CARTILAGE

Patellofemoral: Mild chondral fissuring seen within the central
patellar apex and lateral patellar facet.

Medial compartment: Chondral fissuring and thinning seen in the
weight-bearing surface of the medial femoral condyle with small
marginal osteophytes.

Lateral compartment: There is chondral fissuring seen the
weight-bearing surface of lateral femoral condyle lateral tibial
plateau.

BONES: No fracture. No avascular necrosis. No pathologic marrow
infiltration.

JOINT: There is a moderate knee joint effusion. Edema within Hoffa's
fat pad. No plical thickening.

EXTENSOR MECHANISM: The patellar and quadriceps tendon are intact.
The retinaculum is unremarkable.

POPLITEAL FOSSA: There is a loculated popliteal cyst measuring 2 cm
with evidence of recent partial rupture.

OTHER: Feathery signal seen within the medial head of the
gastrocnemius.
IMPRESSION: 1. Complex tear of the posterior medial meniscus extending to the
root junction with extrusion of the mid body.
2. Nondisplaced tear of the anterior lateral meniscus
3. Intact cruciate ligaments
4. Tricompartmental osteoarthritis, most notable in the medial
compartment
5. Moderate knee joint effusion
6. Loculated popliteal cyst with evidence of recent partial rupture
7. Muscular edema within the medial head of the gastrocnemius.

## 2023-07-16 ENCOUNTER — Ambulatory Visit: Admitting: Orthopedic Surgery

## 2023-07-30 ENCOUNTER — Ambulatory Visit: Admitting: Orthopedic Surgery
